# Patient Record
Sex: Female | Born: 1990 | Race: Black or African American | Hispanic: No | Marital: Single | Smoking: Former smoker
Health system: Southern US, Community
[De-identification: ages and names within clinical notes are randomized; demographics above are authoritative.]

## PROBLEM LIST (undated history)

## (undated) ENCOUNTER — Inpatient Hospital Stay (HOSPITAL_COMMUNITY): Payer: Self-pay

## (undated) DIAGNOSIS — D649 Anemia, unspecified: Secondary | ICD-10-CM

## (undated) HISTORY — PX: NO PAST SURGERIES: SHX2092

---

## 2014-06-19 ENCOUNTER — Other Ambulatory Visit (HOSPITAL_COMMUNITY)
Admission: RE | Admit: 2014-06-19 | Discharge: 2014-06-19 | Disposition: A | Discharge status: Home / Self Care | Payer: BC Managed Care – PPO | Source: Ambulatory Visit | Attending: Family Medicine | Admitting: Family Medicine

## 2014-06-19 DIAGNOSIS — Z113 Encounter for screening for infections with a predominantly sexual mode of transmission: Secondary | ICD-10-CM | POA: Insufficient documentation

## 2014-06-19 DIAGNOSIS — Z124 Encounter for screening for malignant neoplasm of cervix: Secondary | ICD-10-CM | POA: Diagnosis present

## 2014-06-19 DIAGNOSIS — Z1151 Encounter for screening for human papillomavirus (HPV): Secondary | ICD-10-CM | POA: Insufficient documentation

## 2014-06-19 DIAGNOSIS — R87810 Cervical high risk human papillomavirus (HPV) DNA test positive: Secondary | ICD-10-CM | POA: Insufficient documentation

## 2015-08-05 ENCOUNTER — Other Ambulatory Visit (HOSPITAL_COMMUNITY)
Admission: RE | Admit: 2015-08-05 | Discharge: 2015-08-05 | Disposition: A | Discharge status: Home / Self Care | Payer: BLUE CROSS/BLUE SHIELD | Source: Ambulatory Visit | Attending: Family Medicine | Admitting: Family Medicine

## 2015-08-05 DIAGNOSIS — Z1151 Encounter for screening for human papillomavirus (HPV): Secondary | ICD-10-CM | POA: Insufficient documentation

## 2015-08-05 DIAGNOSIS — Z01411 Encounter for gynecological examination (general) (routine) with abnormal findings: Secondary | ICD-10-CM | POA: Insufficient documentation

## 2015-11-03 DIAGNOSIS — Z1151 Encounter for screening for human papillomavirus (HPV): Secondary | ICD-10-CM | POA: Diagnosis present

## 2015-11-03 DIAGNOSIS — Z01411 Encounter for gynecological examination (general) (routine) with abnormal findings: Secondary | ICD-10-CM | POA: Diagnosis present

## 2017-06-29 ENCOUNTER — Inpatient Hospital Stay (HOSPITAL_COMMUNITY): Payer: Medicaid Other

## 2017-06-29 ENCOUNTER — Encounter (HOSPITAL_COMMUNITY): Payer: Self-pay | Admitting: *Deleted

## 2017-06-29 ENCOUNTER — Inpatient Hospital Stay (HOSPITAL_COMMUNITY)
Admission: AD | Admit: 2017-06-29 | Discharge: 2017-06-29 | Disposition: A | Discharge status: Home / Self Care | Payer: Medicaid Other | Source: Ambulatory Visit | Attending: Obstetrics and Gynecology | Admitting: Obstetrics and Gynecology

## 2017-06-29 DIAGNOSIS — O26891 Other specified pregnancy related conditions, first trimester: Secondary | ICD-10-CM | POA: Insufficient documentation

## 2017-06-29 DIAGNOSIS — Z3A01 Less than 8 weeks gestation of pregnancy: Secondary | ICD-10-CM

## 2017-06-29 DIAGNOSIS — R109 Unspecified abdominal pain: Secondary | ICD-10-CM | POA: Insufficient documentation

## 2017-06-29 DIAGNOSIS — O9989 Other specified diseases and conditions complicating pregnancy, childbirth and the puerperium: Secondary | ICD-10-CM

## 2017-06-29 DIAGNOSIS — O209 Hemorrhage in early pregnancy, unspecified: Secondary | ICD-10-CM

## 2017-06-29 HISTORY — DX: Anemia, unspecified: D64.9

## 2017-06-29 LAB — URINALYSIS, ROUTINE W REFLEX MICROSCOPIC
Bacteria, UA: NONE SEEN
Bilirubin Urine: NEGATIVE
GLUCOSE, UA: NEGATIVE mg/dL
Ketones, ur: 5 mg/dL — AB
NITRITE: NEGATIVE
PH: 6 (ref 5.0–8.0)
Protein, ur: NEGATIVE mg/dL
SPECIFIC GRAVITY, URINE: 1.026 (ref 1.005–1.030)

## 2017-06-29 LAB — CBC
HEMATOCRIT: 38.9 % (ref 36.0–46.0)
HEMOGLOBIN: 12.6 g/dL (ref 12.0–15.0)
MCH: 26.2 pg (ref 26.0–34.0)
MCHC: 32.4 g/dL (ref 30.0–36.0)
MCV: 80.9 fL (ref 78.0–100.0)
Platelets: 338 10*3/uL (ref 150–400)
RBC: 4.81 MIL/uL (ref 3.87–5.11)
RDW: 16.3 % — ABNORMAL HIGH (ref 11.5–15.5)
WBC: 12.1 10*3/uL — ABNORMAL HIGH (ref 4.0–10.5)

## 2017-06-29 LAB — HCG, QUANTITATIVE, PREGNANCY: hCG, Beta Chain, Quant, S: 27201 m[IU]/mL — ABNORMAL HIGH (ref <5)

## 2017-06-29 LAB — ABO/RH: ABO/RH(D): B POS

## 2017-06-29 LAB — POCT PREGNANCY, URINE: Preg Test, Ur: POSITIVE — AB

## 2017-06-29 NOTE — MAU Note (Signed)
Pt reports positive preg test on 12/03, states for the last few days she has been having lower abd cramping and spotting.

## 2017-06-29 NOTE — Discharge Instructions (Signed)
Preventing Birth Defects with Folic Acid WHAT IS FOLIC ACID? Folic acid is a vitamin that is used by the body to create new cells and keep the blood healthy. Everyone needs folic acid to stay healthy. It is especially important if you are pregnant. Folic acid is artificial (synthetic). The vitamin in its natural form is called folate. Some foods are natural sources of folate, and other foods have folic acid added to them (fortified foods). You can also buy folic acid supplements or vitamins that contain folic acid. WHY IS FOLIC ACID IMPORTANT DURING PREGNANCY? Folic acid helps reduce your baby's risk of serious birth defects, especially:  Spina bifida. Spina bifida occurs when a baby's spinal column does not develop completely. This can cause serious, long-term (chronic) disabilities.  Anencephaly. This is a birth defect that causes your baby to be missing parts of the brain, scalp, and skull when he or she is born. Most babies born with anencephaly only live for a short amount of time.  Spina bifida and anencephaly are commonly associated with a lack of folic acid during pregnancy. HOW MUCH FOLIC ACID DO I NEED? If you are pregnant or planning to become pregnant, your health care provider may prescribe vitamins that contain the right amount of folic acid that you need.  If you plan to become pregnant, you should take at least 400 mcg (micrograms) of folic acid daily. Birth defects usually occur in the earliest stages of pregnancy, often before you know you are pregnant. Taking folic acid when you start trying to become pregnant can help prevent birth defects.  While you are pregnant, you need at least 600 mcg of folic acid each day.  What nutrition changes can be made? To increase your intake of folate and folic acid, you may:  Eat more foods that are fortified with folic acid. Check food labels to see whether a food contains folic acid. Foods that are commonly fortified with folic acid  include: ? Cereals. ? Pastas. ? Flours. ? White rice. ? Breads.  Eat more foods that are natural sources of folate, such as: ? Legumes, including lentils, peas, and beans. ? Nuts. ? Vegetables, especially dark green leafy vegetables, such as spinach and mustard greens. ? Citrus fruits and juices.  Your health care provider may still recommend that you take a supplement to make sure that you get enough to reduce the risk of birth defects. Where to find support:  Talk with your health care provider or your pharmacist to find a folic acid supplement that is right for you.  Your health care provider may recommend that you see a nutrition specialist (nutritionist). A nutritionist can help you make healthy food choices and get more folate from your diet.  Nutrition education programs may be available through your community health department. Where to find more information: Visit the following websites to learn more about the importance of folic acid.  Centers for Disease Control and Prevention: http://steele.com/www.cdc.gov/ncbddd/folicacid/about.html  The Office on Women's Health: iknowitconsult.comwomenshealth.gov/publications/our-publications/fact-sheet/folic-acid.html  [employer]: ods.RentRule.siod.nih.gov/factsheets/Folate-Consumer  The March of Dimes: www.marchofdimes.org/pregnancy/folic-acid.aspx  Summary  It is important to start taking folic acid when you are planning to become pregnant, because many birth defects can happen before you know that you are pregnant.  You can get more folate and folic acid from your diet. Look for certain foods that are fortified with folic acid.  Your health care provider may recommend that you take a supplement to get enough folic acid. This information is not  intended to replace advice given to you by your health care provider. Make sure you discuss any questions you have with your health care provider. Document Released: 08/04/2015 Document Revised: 01/21/2016  Document Reviewed: 08/04/2015 Elsevier Interactive Patient Education  [employer]2018 Elsevier Inc.

## 2017-06-29 NOTE — MAU Provider Note (Cosign Needed)
  History     CSN: [id number]  Arrival date and time: 06/29/17 1727   None     Chief Complaint  Patient presents with  . Possible Pregnancy  . Abdominal Pain  . Vaginal Bleeding   26 yo G1P0 at 5830w3d based on LMP here for vaginal spotting 2 days ago that resolved spontaneously. Spotting started again today and patient's mother told her to come here for evaluation.Patient says she has very light, mild cramping in lower abdomen. Denies fever, sweats, chills.     No past medical history on file.  No family history on file.  Social History   Tobacco Use  . Smoking status: Not on file  Substance Use Topics  . Alcohol use: Not on file  . Drug use: Not on file    Allergies: Allergies not on file  No medications prior to admission.    Review of Systems  Constitutional: Negative for activity change, appetite change and chills.  HENT: Negative for congestion and ear discharge.   Eyes: Negative for discharge and itching.  Respiratory: Negative for apnea and chest tightness.   Cardiovascular: Negative for chest pain and leg swelling.  Gastrointestinal: Positive for abdominal pain. Negative for abdominal distention.  Endocrine: Negative for cold intolerance and heat intolerance.  Genitourinary: Positive for vaginal bleeding. Negative for vaginal pain.  Allergic/Immunologic: Negative for environmental allergies and food allergies.  Neurological: Negative for dizziness and light-headedness.  Hematological: Negative for adenopathy. Does not bruise/bleed easily.   Physical Exam   Blood pressure (!) 148/70, pulse (!) 108, temperature 98.4 F (36.9 C), temperature source Oral, resp. rate 16, height 5\' 5"  (1.651 m), weight 123 lb (55.8 kg), last menstrual period 05/15/2017, SpO2 100 %.  Physical Exam  Constitutional: She is oriented to person, place, and time. She appears well-developed and well-nourished.  HENT:  Head: Normocephalic and atraumatic.  Eyes: Conjunctivae are  normal. Pupils are equal, round, and reactive to light.  Cardiovascular: Normal rate and intact distal pulses.  Respiratory: Effort normal. No respiratory distress.  GI: Soft. She exhibits no mass. There is no tenderness. There is no rebound and no guarding.  Musculoskeletal: Normal range of motion. She exhibits no edema.  Neurological: She is alert and oriented to person, place, and time.  Skin: Skin is warm and dry.  Psychiatric: She has a normal mood and affect. Her behavior is normal.    MAU Course  Procedures  MDM Transvaginal US and hcg ordered. US showed intrauterine pregnancy and small subchorionic hemorrhage. HCG 27,000. No ectopic but cannot rule out SAB. Patient will follow up in 1 week for repeat hcg.  Assessment and Plan  1. Pregnancy at 6 weeks completed gestation- start prenatal vitamin. Follow up in clinic in 1 week for repeat hcg. Establish prenatal care.  Kimberly Humphrey 06/29/2017, 8:21 PM

## 2017-06-30 LAB — HIV ANTIBODY (ROUTINE TESTING W REFLEX): HIV Screen 4th Generation wRfx: NONREACTIVE

## 2017-07-06 ENCOUNTER — Other Ambulatory Visit: Payer: Self-pay

## 2017-07-06 ENCOUNTER — Inpatient Hospital Stay (HOSPITAL_COMMUNITY)
Admission: AD | Admit: 2017-07-06 | Discharge: 2017-07-06 | Disposition: A | Discharge status: Home / Self Care | Payer: Medicaid Other | Source: Ambulatory Visit | Attending: Obstetrics and Gynecology | Admitting: Obstetrics and Gynecology

## 2017-07-06 DIAGNOSIS — Z3A01 Less than 8 weeks gestation of pregnancy: Secondary | ICD-10-CM | POA: Diagnosis not present

## 2017-07-06 DIAGNOSIS — Z09 Encounter for follow-up examination after completed treatment for conditions other than malignant neoplasm: Secondary | ICD-10-CM | POA: Insufficient documentation

## 2017-07-06 LAB — HCG, QUANTITATIVE, PREGNANCY: HCG, BETA CHAIN, QUANT, S: 76877 m[IU]/mL — AB (ref <5)

## 2017-07-06 NOTE — MAU Provider Note (Signed)
Kimberly Humphrey is a 26 y.o. female G1P0 @ 3526w3d here in MAU for a follow up.  She was here 1 week ago For vaginal spotting. She denies pain or bleeding at this time. Reviewed US with the patient in detail which shows a healthy pregnancy with cardiac activity.  Quant not needed today. Patient agrees with plan of care. Patient to start prenatal care. Return to MAU if symptoms worsen.    Duane Lopeasch, Jay Haskew I, NP 07/06/2017 4:33 PM

## 2017-07-06 NOTE — MAU Note (Signed)
Pt did not need f/u. dc'd by NP.  Lab cancelled, though already drawn, no charge for visit

## 2017-07-06 NOTE — MAU Note (Signed)
Doing good.  No further bleeding, occ cramping, none today.

## 2017-07-17 NOTE — L&D Delivery Note (Signed)
Patient: Kimberly MayersKeydra M Elster MRN: [id number]  GBS status: positive, IAP given  Patient is a 27 y.o. now G1P1001 s/p NSVD at 5967w6d, who was admitted for SOL. AROM 0h 8830m prior to delivery with clear fluid.   Delivery Note At 11:10 PM a viable female was delivered via Vaginal, Spontaneous (Presentation: vertex; ROA ).  APGAR: 7, 9; weight pending.   Placenta status: intact, .  Cord: 3-vessel  Anesthesia:  Epidural Episiotomy: None Lacerations:  Bilateral labial Suture Repair: 3.0 Monocryl Est. Blood Loss (mL):  200  Mom to postpartum.  Baby to Couplet care / Skin to Skin.  Raynelle FanningJulie P. Lio Wehrly, MD OB Fellow 02/11/18, 11:48 PM

## 2017-08-14 ENCOUNTER — Other Ambulatory Visit (HOSPITAL_COMMUNITY)
Admission: RE | Admit: 2017-08-14 | Discharge: 2017-08-14 | Disposition: A | Discharge status: Home / Self Care | Payer: Medicaid Other | Source: Ambulatory Visit | Attending: Advanced Practice Midwife | Admitting: Advanced Practice Midwife

## 2017-08-14 ENCOUNTER — Encounter: Payer: Self-pay | Admitting: *Deleted

## 2017-08-14 ENCOUNTER — Encounter: Payer: Self-pay | Admitting: Advanced Practice Midwife

## 2017-08-14 ENCOUNTER — Ambulatory Visit (INDEPENDENT_AMBULATORY_CARE_PROVIDER_SITE_OTHER): Payer: Medicaid Other | Admitting: Advanced Practice Midwife

## 2017-08-14 DIAGNOSIS — Z3400 Encounter for supervision of normal first pregnancy, unspecified trimester: Secondary | ICD-10-CM

## 2017-08-14 DIAGNOSIS — N760 Acute vaginitis: Secondary | ICD-10-CM | POA: Diagnosis not present

## 2017-08-14 DIAGNOSIS — Z3492 Encounter for supervision of normal pregnancy, unspecified, second trimester: Secondary | ICD-10-CM | POA: Diagnosis present

## 2017-08-14 DIAGNOSIS — Z3A13 13 weeks gestation of pregnancy: Secondary | ICD-10-CM | POA: Insufficient documentation

## 2017-08-14 DIAGNOSIS — O23592 Infection of other part of genital tract in pregnancy, second trimester: Secondary | ICD-10-CM | POA: Diagnosis not present

## 2017-08-14 DIAGNOSIS — Z3401 Encounter for supervision of normal first pregnancy, first trimester: Secondary | ICD-10-CM | POA: Diagnosis not present

## 2017-08-14 DIAGNOSIS — Z23 Encounter for immunization: Secondary | ICD-10-CM

## 2017-08-14 DIAGNOSIS — B9689 Other specified bacterial agents as the cause of diseases classified elsewhere: Secondary | ICD-10-CM | POA: Diagnosis not present

## 2017-08-14 LAB — POCT URINALYSIS DIP (DEVICE)
BILIRUBIN URINE: NEGATIVE
GLUCOSE, UA: NEGATIVE mg/dL
Hgb urine dipstick: NEGATIVE
Ketones, ur: NEGATIVE mg/dL
Nitrite: NEGATIVE
Protein, ur: NEGATIVE mg/dL
SPECIFIC GRAVITY, URINE: 1.015 (ref 1.005–1.030)
Urobilinogen, UA: 1 mg/dL (ref 0.0–1.0)
pH: 7.5 (ref 5.0–8.0)

## 2017-08-14 NOTE — Progress Notes (Signed)
  Subjective:    Kimberly Humphrey is being seen today for her first obstetrical visit.  This is not a planned pregnancy. She is at 8314w0d gestation. Her obstetrical history is significant for None. Relationship with FOB: Not currently together, but cordial . Patient does intend to breast feed. Pregnancy history fully reviewed.  Patient reports headache and earache .  Review of Systems:   Review of Systems  All other systems reviewed and are negative.   Objective:     BP 122/62   Pulse 78   Wt 132 lb (59.9 kg)   LMP 05/15/2017   BMI 21.97 kg/m  Physical Exam  Nursing note and vitals reviewed. Constitutional: She is oriented to person, place, and time. She appears well-developed and well-nourished. No distress.  HENT:  Head: Normocephalic.  Right Ear: No swelling or tenderness. Tympanic membrane is not erythematous. No middle ear effusion.  Left Ear: No swelling or tenderness. Tympanic membrane is not erythematous.  No middle ear effusion.  Cardiovascular: Normal rate.  Respiratory: Effort normal.  GI: Soft. There is no tenderness. There is no rebound.  Genitourinary:  Genitourinary Comments:  External: no lesion Vagina: small amount of white discharge Cervix: pink, smooth, no CMT Uterus: AGA   Neurological: She is alert and oriented to person, place, and time.  Skin: Skin is warm and dry.  Psychiatric: She has a normal mood and affect.      Fetal Exam Fetal Monitor Review: Mode: hand-held doppler probe.   Baseline rate: 161.         Assessment:    Pregnancy: G1P0000 Patient Active Problem List   Diagnosis Date Noted  . Supervision of normal first pregnancy, antepartum 08/14/2017       Plan:     Initial labs drawn. Pap done  Prenatal vitamins. Problem list reviewed and updated. Panorama AFP discussed: requested. Too early today, plan for next visit.  Role of ultrasound in pregnancy discussed; fetal survey: requested. Amniocentesis discussed: not  indicated. Follow up in 7 weeks- baby scripts optimization  50% of 60 min visit spent on counseling and coordination of care.     Kimberly Humphrey 08/14/2017

## 2017-08-14 NOTE — Progress Notes (Signed)
Anatomy scan scheduled 3/12 @ 845am

## 2017-08-14 NOTE — Patient Instructions (Addendum)
AREA PEDIATRIC/FAMILY [place] [address], [address] [place], [place]  [address] Phone - [phone]   Fax - [phone]  [facility] [place] [address] [place], [place] [address] Phone - [phone]   Fax - [place] [address] [place], [place]  [address] Phone - [phone]   Fax - [phone]  [place]. [address] [place] [address] [place], [place]  [address] Phone - [phone]   Fax - [phone]  [place] [address] [place], [place]  [address] Phone - [phone]   Fax - [phone]  [facility] [address] [place], [place]  [address] Phone - [phone]   Fax - [place] [address] [place], [place]  [address] Phone - [phone]   Fax - [phone]  [place] [address] [place], [place] [address] [place], [place]  [address] Phone - [phone]   Fax - [place] [address] [place], [place]  [address] Phone - [phone]   Fax - [phone] [facility] [place]. [address] [place], [place]  [address] Phone - [phone]   Fax - [phone]  [place] [address] [place], [place]  [address] Phone - [phone]   Fax - [phone]  [facility] [place], [place], [place]  [address] Phone - [phone]   Fax - [place] [address] [place], [place]  [address] Phone - [phone]   Fax - [phone]  [facility] [address] [place], [place]  [address] Phone - [place], [place]  [address] Phone - [phone]   Fax - [place] [address] [place], [place]  [address] Phone - [phone]   Fax - [phone]  [place] [address] [place], [place]  [address] Phone - [phone]   Fax - [place]. [place], [place]  [address] Phone - [phone]   Fax - [place], [place], [place]  [address] Phone - [phone]   Fax - [place] [address], [place], [place]  [address] Phone - [phone]   Fax - [phone]  [place] [address] [place], [place]  [address] Phone - [phone]   Fax - [place] [address] [place], [place]  [address] Phone - [phone]   Fax - [phone]  [place] [address] [place], [place]  [address] Phone - [phone]   Fax - [phone] [place] [address] [place], [place]  [address] Phone - [phone]   Fax - [place] [address] [place], [place]  [address] Phone - [phone]   Fax - [place] [address] [place], [place]  [address] Phone - [phone]   Fax - [phone]  [place] MD [address] [place] [address] Phone [phone]  Fax [phone]  Places to have your son circumcised:    [facility] [phone] while you  are in hospital  [facility] [phone] $244 by 4 wks  [facility] [phone] $175 by 2 wks  [place] [phone] $250 by 7 days [facility] [phone] $269 by 4 wks  These prices sometimes change but are roughly what you can expect to pay. Please  call and confirm pricing.   Circumcision is considered an elective/non-medically necessary procedure. There are many reasons parents decide to have their sons circumsized. During the first year of life circumcised males have a reduced risk of urinary tract infections but after this year the rates between circumcised males and uncircumcised males are the same.  It is safe to have your son circumcised outside of the hospital and the places above perform them regularly.   Deciding about Circumcision in Baby Boys  (Up-to-date The Basics)  What is circumcision?  Circumcision is a surgery that removes the skin that covers the tip of the penis, called the "foreskin" Circumcision is usually done when a boy is between 42 and 85 days old. In the [place], circumcision is common. In some other countries, fewer boys are circumcised. Circumcision is a common tradition in some religions.  Should I have my baby boy circumcised?  There is no easy answer. Circumcision has some benefits. But it also has risks. After talking with your doctor, you will have to decide for yourself what is right for your family.  What are the benefits of circumcision?  Circumcised boys seem to have slightly lower rates of: ?Urinary tract infections ?Swelling of the opening at the tip of the penis Circumcised men seem to have slightly lower rates of: ?Urinary tract infections ?Swelling of the opening at the tip of the penis ?Penis cancer ?HIV and other infections that you catch during sex ?Cervical cancer in the women they have sex with Even so, in the [place], the risks of these problems are small - even in boys and men who have not been circumcised. Plus, boys and men who are not circumcised can reduce these extra risks by: ?Cleaning their penis well ?Using condoms during sex  What are the risks of circumcision?  Risks include: ?Bleeding or infection from the surgery ?Damage to or amputation of the  penis ?A chance that the doctor will cut off too much or not enough of the foreskin ?A chance that sex won't feel as good later in life Only about 1 out of every 200 circumcisions leads to problems. There is also a chance that your health insurance won't pay for circumcision.  How is circumcision done in baby boys?  First, the baby gets medicine for pain relief. This might be a cream on the skin or a shot into the base of the penis. Next, the doctor cleans the baby's penis well. Then he or she uses special tools to cut off the foreskin. Finally, the doctor wraps a bandage (called gauze) around the baby's penis. If you have your baby circumcised, his doctor or nurse will give you instructions on how to care for him after the surgery. It is important that you follow those instructions carefully. Childbirth Education Options: [facility] Department Classes:  Childbirth education classes can help you get ready for a positive parenting experience. You can also meet other expectant parents and get free stuff for your baby. Each class runs for five weeks on the same night and costs $45 for the mother-to-be and her support person. Medicaid covers the cost if you are eligible. Call [phone] to register. [facility] Childbirth Education:  [phone]  or [phone] or [email or web address]  Baby & Me Class: Discuss newborn & infant parenting and family adjustment issues with other new mothers in a relaxed environment. Each week brings a new speaker or baby-centered activity. We encourage new mothers to join us every Thursday at 11:00am. Babies birth until crawling. No registration or fee. Daddy Boot Camp: This course offers Dads-to-be the tools and knowledge needed to feel confident on their journey to becoming new fathers. Experienced dads, who have been trained as coaches, teach dads-to-be how to hold, comfort, diaper, swaddle and play with their infant while being able to support  the new mom as well. A class for men taught by men. $25/dad Big Brother/Big Sister: Let your children share in the joy of a new brother or sister in this special class designed just for them. Class includes discussion about how families care for babies: swaddling, holding, diapering, safety as well as how they can be helpful in their new role. This class is designed for children ages 2 to 6, but any age is welcome. Please register each child individually. $5/child  Mom Talk: This mom-led group offers support and connection to mothers as they journey through the adjustments and struggles of that sometimes overwhelming first year after the birth of a child. Tuesdays at 10:00am and Thursdays at 6:00pm. Babies welcome. No registration or fee. Breastfeeding Support Group: This group is a mother-to-mother support circle where moms have the opportunity to share their breastfeeding experiences. A Lactation Consultant is present for questions and concerns. Meets each Tuesday at 11:00am. No fee or registration. Breastfeeding Your Baby: Learn what to expect in the first days of breastfeeding your newborn.  This class will help you feel more confident with the skills needed to begin your breastfeeding experience. Many new mothers are concerned about breastfeeding after leaving the hospital. This class will also address the most common fears and challenges about breastfeeding during the first few weeks, months and beyond. (call for fee) Comfort Techniques and Tour: This 2 hour interactive class will provide you the opportunity to learn & practice hands-on techniques that can help relieve some of the discomfort of labor and encourage your baby to rotate toward the best position for birth. You and your partner will be able to try a variety of labor positions with birth balls and rebozos as well as practice breathing, relaxation, and visualization techniques. A tour of the [facility] is included  with this class. $20 per registrant and support person Childbirth Class- Weekend Option: This class is a Weekend version of our Birth & Baby series. It is designed for parents who have a difficult time fitting several weeks of classes into their schedule. It covers the care of your newborn and the basics of labor and childbirth. It also includes a Maternity Care Center Tour of [facility] and lunch. The class is held two consecutive days: beginning on Friday evening from 6:30 - 8:30 p.m. and the next day, Saturday from 9 a.m. - 4 p.m. (call for fee) Waterbirth Class: Interested in a waterbirth?  This informational class will help you discover whether waterbirth is the right fit for you. Education about waterbirth itself, supplies you would need and how to assemble your support team is what you can expect from this class. Some obstetrical practices require this class in order to pursue a waterbirth. (Not all obstetrical practices offer waterbirth-check with your healthcare provider.) Register only the expectant mom, but you are encouraged to bring your partner to   class! Required if planning waterbirth, no fee. Infant/Child CPR: Parents, grandparents, babysitters, and friends learn Cardio-Pulmonary Resuscitation skills for infants and children. You will also learn how to treat both conscious and unconscious choking in infants and children. This Family & Friends program does not offer certification. Register each participant individually to ensure that enough mannequins are available. (Call for fee) Grandparent Love: Expecting a grandbaby? This class is for you! Learn about the latest infant care and safety recommendations and ways to support your own child as he or she transitions into the parenting role. Taught by Registered Nurses who are childbirth instructors, but most importantly...they are grandmothers too! $10/person. Childbirth Class- Natural Childbirth: This series of 5 weekly classes is for  expectant parents who want to learn and practice natural methods of coping with the process of labor and childbirth. Relaxation, breathing, massage, visualization, role of the partner, and helpful positioning are highlighted. Participants learn how to be confident in their body's ability to give birth. This class will empower and help parents make informed decisions about their own care. Includes discussion that will help new parents transition into the immediate postpartum period. [facility] is included. We suggest taking this class between 25-32 weeks, but it's only a recommendation. $75 per registrant and one support person or $30 Medicaid. Childbirth Class- 3 week Series: This option of 3 weekly classes helps you and your labor partner prepare for childbirth. Newborn care, labor & birth, cesarean birth, pain management, and comfort techniques are discussed and a [place] [facility] is included. The class meets at the same time, on the same day of the week for 3 consecutive weeks beginning with the starting date you choose. $60 for registrant and one support person.  Marvelous Multiples: Expecting twins, triplets, or more? This class covers the differences in labor, birth, parenting, and breastfeeding issues that face multiples' parents. NICU tour is included. Led by a Certified Childbirth Educator who is the mother of twins. No fee. Caring for Baby: This class is for expectant and adoptive parents who want to learn and practice the most up-to-date newborn care for their babies. Focus is on birth through the first six weeks of life. Topics include feeding, bathing, diapering, crying, umbilical cord care, circumcision care and safe sleep. Parents learn to recognize symptoms of illness and when to call the pediatrician. Register only the mom-to-be and your partner or support person can plan to come with you! $10 per registrant and support  person Childbirth Class- online option: This online class offers you the freedom to complete a Birth and Baby series in the comfort of your own home. The flexibility of this option allows you to review sections at your own pace, at times convenient to you and your support people. It includes additional video information, animations, quizzes, and extended activities. Get organized with helpful eClass tools, checklists, and trackers. Once you register online for the class, you will receive an email within a few days to accept the invitation and begin the class when the time is right for you. The content will be available to you for 60 days. $60 for 60 days of online access for you and your support people.  Local Doulas: Natural Baby Doulas [email or web address] Tel: [phone] [email or web address] [employer] [phone] [email or web address] .[email or web address] The Labor Hassell Halim  (also do waterbirth tub rental) [phone] [email or web address] Triad Birth Doula [phone] [email or web address] [email or web address] Sacred Rhythms  [phone] https://[email or web address]-[email or web address]/ [employer] (PADA) [email or web address] https://www.[email or web address]/ [place]  [place] Birth and Baby  http://[email or web address]/ Considering Waterbirth? Guide for patients at [facility] [employer]  Why consider waterbirth?  . Gentle birth for babies . Less pain medicine used in labor . May allow for passive descent/less pushing . May reduce perineal tears  . More mobility and instinctive maternal position changes . Increased maternal relaxation . Reduced blood pressure in labor  Is waterbirth safe? What are the risks of infection, drowning or other complications?  . Infection: o Very low risk (3.7 % for tub vs 4.8% for bed) o 7 in 8000 waterbirths with documented infection o Poorly  cleaned equipment most common cause o Slightly lower group B strep transmission rate  . Drowning o Maternal:  - Very low risk   - Related to seizures or fainting o Newborn:  - Very low risk. No evidence of increased risk of respiratory problems in multiple large studies - Physiological protection from breathing under water - Avoid underwater birth if there are any fetal complications - Once baby's head is out of the water, keep it out.  . Birth complication o Some reports of cord trauma, but risk decreased by bringing baby to surface gradually o No evidence of increased risk of shoulder dystocia. Mothers can usually change positions faster in water than in a bed, possibly aiding the maneuvers to free the shoulder.   You must attend a Doren Custard class at [facility]  3rd Wednesday of every month from 7-9pm  [employer] by calling [phone] or online at [email or web address]  Bring [place] the certificate from the class to your prenatal appointment  Meet with a midwife at 36 weeks to see if you can still plan a waterbirth and to sign the consent.   Purchase or rent the following supplies:   Water Birth Pool (Birth Pool in a Box or [place] for instance)  (Tubs start ~$125)  Single-use disposable tub liner designed for your brand of tub  New garden hose labeled "lead-free", "suitable for drinking water",  Electric drain pump to remove water (We recommend 792 gallon per hour or greater pump.)   Separate garden hose to remove the dirty water  Fish net  Bathing suit top (optional)  Long-handled mirror (optional)  Places to purchase or rent supplies  GotWebTools.is for tub purchases and supplies  Waterbirthsolutions.com for tub purchases and supplies  The Labor Ladies (www.[email or web address]) $275 for tub rental/set-up & take down/kit   [employer] (http://www.fleming.com/.htm) Information regarding doulas (labor support) who  provide pool rentals  Our practice has a Birth Pool in a Box tub at the hospital that you may borrow on a first-come-first-served basis. It is your responsibility to to set up, clean and break down the tub. We cannot guarantee the availability of this tub in advance. You are responsible for bringing all accessories listed above. If you do not have all necessary supplies you cannot have a waterbirth.    Things that would prevent you from having a waterbirth:  Premature, <37wks  Previous cesarean birth  Presence of thick meconium-stained fluid  Multiple gestation (Twins, triplets, etc.)  Uncontrolled diabetes or gestational diabetes requiring medication  Hypertension requiring medication or diagnosis of pre-eclampsia  Heavy vaginal bleeding  Non-reassuring fetal heart rate  Active infection (MRSA, etc.). Group B Strep is NOT a contraindication for  waterbirth.  If your labor has to be induced and induction method requires continuous  monitoring of the baby's heart rate  Other risks/issues identified by your obstetrical provider  Please remember  that birth is unpredictable. Under certain unforeseeable circumstances your provider may advise against giving birth in the tub. These decisions will be made on a case-by-case basis and with the safety of you and your baby as our highest priority.   Safe Medications in Pregnancy   Acne: Benzoyl Peroxide Salicylic Acid  Backache/Headache: Tylenol: 2 regular strength every 4 hours OR              2 Extra strength every 6 hours  Colds/Coughs/Allergies: Benadryl (alcohol free) 25 mg every 6 hours as needed Breath right strips Claritin Cepacol throat lozenges Chloraseptic throat spray Cold-Eeze- up to three times per day Cough drops, alcohol free Flonase (by prescription only) Guaifenesin Mucinex Robitussin DM (plain only, alcohol free) Saline nasal spray/drops Sudafed (pseudoephedrine) & Actifed ** use only after [redacted] weeks  gestation and if you do not have high blood pressure Tylenol Vicks Vaporub Zinc lozenges Zyrtec   Constipation: Colace Ducolax suppositories Fleet enema Glycerin suppositories Metamucil Milk of magnesia Miralax Senokot Smooth move tea  Diarrhea: Kaopectate Imodium A-D  *NO pepto Bismol  Hemorrhoids: Anusol Anusol HC Preparation H Tucks  Indigestion: Tums Maalox Mylanta Zantac  Pepcid  Insomnia: Benadryl (alcohol free) 25mg every 6 hours as needed Tylenol PM Unisom, no Gelcaps  Leg Cramps: Tums MagGel  Nausea/Vomiting:  [place] Dramamine Emetrol Ginger extract Sea bands Meclizine  Nausea medication to take during pregnancy:  Unisom (doxylamine succinate 25 mg tablets) Take one tablet daily at bedtime. If symptoms are not adequately controlled, the dose can be increased to a maximum recommended dose of two tablets daily (1/2 tablet in the morning, 1/2 tablet mid-afternoon and one at bedtime). Vitamin B6 100mg tablets. Take one tablet twice a day (up to 200 mg per day).  Skin Rashes: Aveeno products Benadryl cream or 25mg every 6 hours as needed Calamine Lotion 1% cortisone cream  Yeast infection: Gyne-lotrimin 7 Monistat 7   **If taking multiple medications, please check labels to avoid duplicating the same active ingredients **take medication as directed on the label ** Do not exceed 4000 mg of tylenol in 24 hours **Do not take medications that contain aspirin or ibuprofen     

## 2017-08-15 LAB — CERVICOVAGINAL ANCILLARY ONLY
Bacterial vaginitis: POSITIVE — AB
CANDIDA VAGINITIS: NEGATIVE
Trichomonas: NEGATIVE

## 2017-08-15 LAB — CYTOLOGY - PAP
Chlamydia: POSITIVE — AB
Diagnosis: NEGATIVE
NEISSERIA GONORRHEA: NEGATIVE

## 2017-08-16 ENCOUNTER — Telehealth: Payer: Self-pay

## 2017-08-16 DIAGNOSIS — N760 Acute vaginitis: Principal | ICD-10-CM

## 2017-08-16 DIAGNOSIS — B9689 Other specified bacterial agents as the cause of diseases classified elsewhere: Secondary | ICD-10-CM

## 2017-08-16 DIAGNOSIS — A749 Chlamydial infection, unspecified: Secondary | ICD-10-CM

## 2017-08-16 LAB — URINE CULTURE, OB REFLEX: Organism ID, Bacteria: NO GROWTH

## 2017-08-16 LAB — CULTURE, OB URINE

## 2017-08-16 MED ORDER — AZITHROMYCIN 250 MG PO TABS: 1000.0000 mg | ORAL_TABLET | Freq: Once | ORAL | 0 refills | Status: AC

## 2017-08-16 MED ORDER — METRONIDAZOLE 500 MG PO TABS: 500.0000 mg | ORAL_TABLET | Freq: Two times a day (BID) | ORAL | 0 refills | Status: DC

## 2017-08-16 NOTE — Telephone Encounter (Signed)
Pt tested positive for Chlamydia and BV. Called pt two times. Got vm. Left message for pt to call our office back.[facility]

## 2017-08-17 ENCOUNTER — Encounter: Payer: Self-pay | Admitting: Advanced Practice Midwife

## 2017-08-17 ENCOUNTER — Telehealth: Payer: Self-pay | Admitting: *Deleted

## 2017-08-17 NOTE — Telephone Encounter (Signed)
Pt returned call, think it may be for test results.

## 2017-08-21 ENCOUNTER — Encounter: Payer: Self-pay | Admitting: Family Medicine

## 2017-08-21 DIAGNOSIS — Z3400 Encounter for supervision of normal first pregnancy, unspecified trimester: Secondary | ICD-10-CM

## 2017-08-21 NOTE — Progress Notes (Signed)
Chart reviewed.

## 2017-08-22 LAB — OBSTETRIC PANEL, INCLUDING HIV
ANTIBODY SCREEN: NEGATIVE
BASOS ABS: 0 10*3/uL (ref 0.0–0.2)
BASOS: 0 %
EOS (ABSOLUTE): 0 10*3/uL (ref 0.0–0.4)
Eos: 0 %
HEMATOCRIT: 31.1 % — AB (ref 34.0–46.6)
HIV Screen 4th Generation wRfx: NONREACTIVE
Hemoglobin: 10.4 g/dL — ABNORMAL LOW (ref 11.1–15.9)
Hepatitis B Surface Ag: NEGATIVE
IMMATURE GRANS (ABS): 0 10*3/uL (ref 0.0–0.1)
IMMATURE GRANULOCYTES: 0 %
LYMPHS: 21 %
Lymphocytes Absolute: 1.9 10*3/uL (ref 0.7–3.1)
MCH: 27 pg (ref 26.6–33.0)
MCHC: 33.4 g/dL (ref 31.5–35.7)
MCV: 81 fL (ref 79–97)
MONOCYTES: 10 %
MONOS ABS: 0.9 10*3/uL (ref 0.1–0.9)
NEUTROS PCT: 69 %
Neutrophils Absolute: 5.9 10*3/uL (ref 1.4–7.0)
PLATELETS: 280 10*3/uL (ref 150–379)
RBC: 3.85 x10E6/uL (ref 3.77–5.28)
RDW: 18.6 % — AB (ref 12.3–15.4)
RPR Ser Ql: NONREACTIVE
RUBELLA: 4.17 {index} (ref >0.99)
Rh Factor: POSITIVE
WBC: 8.7 10*3/uL (ref 3.4–10.8)

## 2017-08-22 LAB — HEMOGLOBINOPATHY EVALUATION
Ferritin: 17 ng/mL (ref 15–150)
HGB A: 97.8 % (ref 96.4–98.8)
HGB F QUANT: 0 % (ref 0.0–2.0)
HGB S: 0 %
HGB SOLUBILITY: NEGATIVE
HGB VARIANT: 0 %
Hgb A2 Quant: 2.2 % (ref 1.8–3.2)
Hgb C: 0 %

## 2017-08-22 LAB — SMN1 COPY NUMBER ANALYSIS (SMA CARRIER SCREENING)

## 2017-08-23 ENCOUNTER — Encounter: Payer: Self-pay | Admitting: *Deleted

## 2017-08-24 NOTE — Telephone Encounter (Signed)
Attempted to call patient back to see if she still needed assistance. Left message to call us back if she still has concerns.

## 2017-08-28 ENCOUNTER — Other Ambulatory Visit: Payer: Self-pay | Admitting: General Practice

## 2017-08-28 DIAGNOSIS — B379 Candidiasis, unspecified: Secondary | ICD-10-CM

## 2017-08-28 DIAGNOSIS — T3695XA Adverse effect of unspecified systemic antibiotic, initial encounter: Principal | ICD-10-CM

## 2017-08-28 MED ORDER — TERCONAZOLE 0.4 % VA CREA: 1.0000 | TOPICAL_CREAM | Freq: Every day | VAGINAL | 0 refills | Status: DC

## 2017-09-10 ENCOUNTER — Encounter: Payer: Self-pay | Admitting: Advanced Practice Midwife

## 2017-09-17 ENCOUNTER — Encounter (HOSPITAL_COMMUNITY): Payer: Self-pay | Admitting: Advanced Practice Midwife

## 2017-09-25 ENCOUNTER — Other Ambulatory Visit: Payer: Self-pay | Admitting: Advanced Practice Midwife

## 2017-09-25 ENCOUNTER — Ambulatory Visit (HOSPITAL_COMMUNITY)
Admission: RE | Admit: 2017-09-25 | Discharge: 2017-09-25 | Disposition: A | Discharge status: Home / Self Care | Payer: Medicaid Other | Source: Ambulatory Visit | Attending: Advanced Practice Midwife | Admitting: Advanced Practice Midwife

## 2017-09-25 DIAGNOSIS — Z3689 Encounter for other specified antenatal screening: Secondary | ICD-10-CM | POA: Diagnosis not present

## 2017-09-25 DIAGNOSIS — O358XX0 Maternal care for other (suspected) fetal abnormality and damage, not applicable or unspecified: Secondary | ICD-10-CM

## 2017-09-25 DIAGNOSIS — Z3A19 19 weeks gestation of pregnancy: Secondary | ICD-10-CM

## 2017-09-25 DIAGNOSIS — O35EXX0 Maternal care for other (suspected) fetal abnormality and damage, fetal genitourinary anomalies, not applicable or unspecified: Secondary | ICD-10-CM

## 2017-09-25 DIAGNOSIS — Z3400 Encounter for supervision of normal first pregnancy, unspecified trimester: Secondary | ICD-10-CM

## 2017-10-03 ENCOUNTER — Other Ambulatory Visit (HOSPITAL_COMMUNITY)
Admission: RE | Admit: 2017-10-03 | Discharge: 2017-10-03 | Disposition: A | Discharge status: Home / Self Care | Payer: Medicaid Other | Source: Ambulatory Visit | Attending: Advanced Practice Midwife | Admitting: Advanced Practice Midwife

## 2017-10-03 ENCOUNTER — Ambulatory Visit (INDEPENDENT_AMBULATORY_CARE_PROVIDER_SITE_OTHER): Payer: Medicaid Other | Admitting: Advanced Practice Midwife

## 2017-10-03 ENCOUNTER — Encounter: Payer: Self-pay | Admitting: Advanced Practice Midwife

## 2017-10-03 VITALS — BP 122/62 | HR 72 | Wt 136.6 lb

## 2017-10-03 DIAGNOSIS — A749 Chlamydial infection, unspecified: Secondary | ICD-10-CM | POA: Insufficient documentation

## 2017-10-03 DIAGNOSIS — Z3402 Encounter for supervision of normal first pregnancy, second trimester: Secondary | ICD-10-CM

## 2017-10-03 DIAGNOSIS — Z3400 Encounter for supervision of normal first pregnancy, unspecified trimester: Secondary | ICD-10-CM

## 2017-10-03 NOTE — Patient Instructions (Addendum)
At your next appointment at 28 weeks we will do a test for pregnancy diabetes. Please make sure that when you come to your appointment you have not had anything to eat or drink after midnight the night before. The test will take 2 hours so you will be here for a longer than usual time at your next appointment. Please plan for this.   Childbirth Education Options: [facility] Department Classes:  Childbirth education classes can help you get ready for a positive parenting experience. You can also meet other expectant parents and get free stuff for your baby. Each class runs for five weeks on the same night and costs $45 for the mother-to-be and her support person. Medicaid covers the cost if you are eligible. Call [phone] to register. [facility] Childbirth Education:  [phone] or [phone] or [email or web address]  Baby & Me Class: Discuss newborn & infant parenting and family adjustment issues with other new mothers in a relaxed environment. Each week brings a new speaker or baby-centered activity. We encourage new mothers to join [place] every Thursday at 11:00am. Babies birth until crawling. No registration or fee. Daddy [employer]: This course offers Dads-to-be the tools and knowledge needed to feel confident on their journey to becoming new fathers. Experienced dads, who have been trained as coaches, teach dads-to-be how to hold, comfort, diaper, swaddle and play with their infant while being able to support the new mom as well. A class for men taught by men. $25/dad Big Brother/Big Sister: Let your children share in the joy of a new brother or sister in this special class designed just for them. Class includes discussion about how families care for babies: swaddling, holding, diapering, safety as well as how they can be helpful in their new role. This class is designed for children ages 29 to 1, but any age is welcome. Please register each child individually. $5/child  Mom  Talk: This mom-led group offers support and connection to mothers as they journey through the adjustments and struggles of that sometimes overwhelming first year after the birth of a child. Tuesdays at 10:00am and Thursdays at 6:00pm. Babies welcome. No registration or fee. Breastfeeding Support Group: This group is a mother-to-mother support circle where moms have the opportunity to share their breastfeeding experiences. A Lactation Consultant is present for questions and concerns. Meets each Tuesday at 11:00am. No fee or registration. Breastfeeding Your Baby: Learn what to expect in the first days of breastfeeding your newborn.  This class will help you feel more confident with the skills needed to begin your breastfeeding experience. Many new mothers are concerned about breastfeeding after leaving the hospital. This class will also address the most common fears and challenges about breastfeeding during the first few weeks, months and beyond. (call for fee) Comfort Techniques and Tour: This 2 hour interactive class will provide you the opportunity to learn & practice hands-on techniques that can help relieve some of the discomfort of labor and encourage your baby to rotate toward the best position for birth. You and your partner will be able to try a variety of labor positions with birth balls and rebozos as well as practice breathing, relaxation, and visualization techniques. A tour of the [facility] is included with this class. $20 per registrant and support person Childbirth Class- Weekend Option: This class is a Weekend version of our Birth & Baby series. It is designed for parents who have a difficult time fitting several weeks of classes into their  schedule. It covers the care of your newborn and the basics of labor and childbirth. It also includes a [place] [facility] and lunch. The class is held two consecutive days: beginning on Friday evening  from 6:30 - 8:30 p.m. and the next day, Saturday from 9 a.m. - 4 p.m. (call for fee) Doren Custard Class: Interested in a waterbirth?  This informational class will help you discover whether waterbirth is the right fit for you. Education about waterbirth itself, supplies you would need and how to assemble your support team is what you can expect from this class. Some obstetrical practices require this class in order to pursue a waterbirth. (Not all obstetrical practices offer waterbirth-check with your healthcare provider.) Register only the expectant mom, but you are encouraged to bring your partner to class! Required if planning waterbirth, no fee. Infant/Child CPR: Parents, grandparents, babysitters, and friends learn Cardio-Pulmonary Resuscitation skills for infants and children. You will also learn how to treat both conscious and unconscious choking in infants and children. This Family & Friends program does not offer certification. Register each participant individually to ensure that enough mannequins are available. (Call for fee) Grandparent Love: Expecting a grandbaby? This class is for you! Learn about the latest infant care and safety recommendations and ways to support your own child as he or she transitions into the parenting role. Taught by Registered Nurses who are childbirth instructors, but most importantly...they are grandmothers too! $10/person. Childbirth Class- Natural Childbirth: This series of 5 weekly classes is for expectant parents who want to learn and practice natural methods of coping with the process of labor and childbirth. Relaxation, breathing, massage, visualization, role of the partner, and helpful positioning are highlighted. Participants learn how to be confident in their body's ability to give birth. This class will empower and help parents make informed decisions about their own care. Includes discussion that will help new parents transition into the immediate postpartum  period. [facility] is included. We suggest taking this class between 25-32 weeks, but it's only a recommendation. $75 per registrant and one support person or $30 Medicaid. Childbirth Class- 3 week Series: This option of 3 weekly classes helps you and your labor partner prepare for childbirth. Newborn care, labor & birth, cesarean birth, pain management, and comfort techniques are discussed and a [place] [facility] is included. The class meets at the same time, on the same day of the week for 3 consecutive weeks beginning with the starting date you choose. $60 for registrant and one support person.  Marvelous Multiples: Expecting twins, triplets, or more? This class covers the differences in labor, birth, parenting, and breastfeeding issues that face multiples' parents. NICU tour is included. Led by a Certified Childbirth Educator who is the mother of twins. No fee. Caring for Baby: This class is for expectant and adoptive parents who want to learn and practice the most up-to-date newborn care for their babies. Focus is on birth through the first six weeks of life. Topics include feeding, bathing, diapering, crying, umbilical cord care, circumcision care and safe sleep. Parents learn to recognize symptoms of illness and when to call the pediatrician. Register only the mom-to-be and your partner or support person can plan to come with you! $10 per registrant and support person Childbirth Class- online option: This online class offers you the freedom to complete a Birth and Baby series in the comfort of your own home. The flexibility of  this option allows you to review sections at your own pace, at times convenient to you and your support people. It includes additional video information, animations, quizzes, and extended activities. Get organized with helpful eClass tools, checklists, and trackers. Once you register online for the class, you will  receive an email within a few days to accept the invitation and begin the class when the time is right for you. The content will be available to you for 60 days. $60 for 60 days of online access for you and your support people.  Local Doulas: Natural Baby Doulas [email or web address] Tel: [phone] [email or web address] [employer] [phone] [email or web address].[email or web address] The Labor Hassell Halim  (also do waterbirth tub rental) [phone] [email or web address] [facility] Birth Doula ([phone] [email or web address] NotebookDistributors.fi Sacred Rhythms  [phone] https://[email or web address]-[email or web address]/ [employer] (PADA) [email or web address] https://www.[email or web address]/ [place] Birth [facility] Baby  http://[email or web address]/ Considering Waterbirth? Guide for patients at [facility] [employer]  Why consider waterbirth?  . Gentle birth for babies . Less pain medicine used in labor . May allow for passive descent/less pushing . May reduce perineal tears  . More mobility and instinctive maternal position changes . Increased maternal relaxation . Reduced blood pressure in labor  Is waterbirth safe? What are the risks of infection, drowning or other complications?  . Infection: o Very low risk (3.7 % for tub vs 4.8% for bed) o 7 in 8000 waterbirths with documented infection o Poorly cleaned equipment most common cause o Slightly lower group B strep transmission rate  . Drowning o Maternal:  - Very low risk   - Related to seizures or fainting o Newborn:  - Very low risk. No evidence of increased risk of respiratory problems in multiple large studies - Physiological protection from breathing under water - Avoid underwater birth if there are any fetal complications - Once baby's head is out of the water, keep it out.  . Birth complication o Some  reports of cord trauma, but risk decreased by bringing baby to surface gradually o No evidence of increased risk of shoulder dystocia. Mothers can usually change positions faster in water than in a bed, possibly aiding the maneuvers to free the shoulder.   You must attend a Doren Custard class at [facility]  3rd Wednesday of every month from 7-9pm  [employer] by calling [phone] or online at [email or web address]  Bring [place] the certificate from the class to your prenatal appointment  Meet with a midwife at 36 weeks to see if you can still plan a waterbirth and to sign the consent.   Purchase or rent the following supplies:   Water Birth Pool (Birth Pool in a Box or [place] for instance)  (Tubs start ~$125)  Single-use disposable tub liner designed for your brand of tub  New garden hose labeled "lead-free", "suitable for drinking water",  Electric drain pump to remove water (We recommend 792 gallon per hour or greater pump.)   Separate garden hose to remove the dirty water  Fish net  Bathing suit top (optional)  Long-handled mirror (optional)  Places to purchase or rent supplies  GotWebTools.is for tub purchases and supplies  Waterbirthsolutions.com for tub purchases and supplies  The Labor Ladies (www.[email or web address]) $275 for tub rental/set-up & take down/kit   [employer] (http://www.fleming.com/.htm) Information regarding doulas (labor support) who provide pool rentals  Our practice has a Birth Pool in a Box tub at the hospital that you may borrow on a first-come-first-served basis. It is your responsibility to to set up, clean  and break down the tub. We cannot guarantee the availability of this tub in advance. You are responsible for bringing all accessories listed above. If you do not have all necessary supplies you cannot have a waterbirth.    Things that would prevent you from having a waterbirth:  Premature,  <37wks  Previous cesarean birth  Presence of thick meconium-stained fluid  Multiple gestation (Twins, triplets, etc.)  Uncontrolled diabetes or gestational diabetes requiring medication  Hypertension requiring medication or diagnosis of pre-eclampsia  Heavy vaginal bleeding  Non-reassuring fetal heart rate  Active infection (MRSA, etc.). Group B Strep is NOT a contraindication for  waterbirth.  If your labor has to be induced and induction method requires continuous  monitoring of the baby's heart rate  Other risks/issues identified by your obstetrical provider  Please remember that birth is unpredictable. Under certain unforeseeable circumstances your provider may advise against giving birth in the tub. These decisions will be made on a case-by-case basis and with the safety of you and your baby as our highest priority.

## 2017-10-03 NOTE — Progress Notes (Signed)
Subjective:     Patient ID: Kimberly Humphrey, female   DOB: 05/05/1991, 27 y.o.   MRN: [id number]  HPI Kimberly Humphrey is a 27 y.o. G1P0000 at 5537w1d being seen today for ongoing prenatal care. She is being monitored for low risk pregnancy and supervision on normal a normal progressing pregnancy.  At this time she does not report vaginal bleeding, vaginal leaking, or premature contractions. She states that she is able to feel fetal movements and there has been no change in movements.  Her only complaints at this appointment is some mild nausea, for which she is taking ginger chews, and some mild nasal congestion.   Review of Systems  Constitutional: Negative for appetite change.  HENT: Positive for congestion.   Gastrointestinal: Positive for nausea.  Endocrine: Negative for polydipsia.  Genitourinary: Negative for difficulty urinating, dysuria, pelvic pain, vaginal bleeding and vaginal discharge.       Objective:   Physical Exam  Constitutional: She is oriented to person, place, and time. She appears well-developed and well-nourished.  HENT:  Head: Normocephalic and atraumatic.  Cardiovascular: Normal rate, regular rhythm and normal heart sounds.  Abdominal: Soft.  Neurological: She is alert and oriented to person, place, and time.       Assessment:     -Surveillance of normal pregnancy      Plan:     -use Afrin as needed for nasal congestion - AFP -schedule US -repeat urine GC/chlymedia test -continue using Baby Scripts -follow up in 8 weeks for next prenatal visit and 2 hour GTT

## 2017-10-03 NOTE — Progress Notes (Signed)
   PRENATAL VISIT NOTE  Subjective:  Kimberly Humphrey is a 27 y.o. G1P0000 at 5559w1d being seen today for ongoing prenatal care.  She is currently monitored for the following issues for this low-risk pregnancy and has Supervision of normal first pregnancy, antepartum on their problem list.  Patient reports no complaints.  Contractions: Not present. Vag. Bleeding: None.  Movement: Present. Denies leaking of fluid.   The following portions of the patient's history were reviewed and updated as appropriate: allergies, current medications, past family history, past medical history, past social history, past surgical history and problem list. Problem list updated.  Objective:   Vitals:   10/03/17 0923  BP: 122/62  Pulse: 72  Weight: 136 lb 9.6 oz (62 kg)    Fetal Status: Fetal Heart Rate (bpm): 143 Fundal Height: 21 cm Movement: Present     General:  Alert, oriented and cooperative. Patient is in no acute distress.  Skin: Skin is warm and dry. No rash noted.   Cardiovascular: Normal heart rate noted  Respiratory: Normal respiratory effort, no problems with respiration noted  Abdomen: Soft, gravid, appropriate for gestational age.  Pain/Pressure: Present     Pelvic: Cervical exam deferred        Extremities: Normal range of motion.  Edema: None  Mental Status:  Normal mood and affect. Normal behavior. Normal judgment and thought content.   Assessment and Plan:  Pregnancy: G1P0000 at 6159w1d  1. Supervision of normal first pregnancy, antepartum  - US MFM OB FOLLOW UP; Future - AFP only - GC/CT, TOC today   Preterm labor symptoms and general obstetric precautions including but not limited to vaginal bleeding, contractions, leaking of fluid and fetal movement were reviewed in detail with the patient. Please refer to After Visit Summary for other counseling recommendations.  Return in about 8 weeks (around 11/28/2017).   Thressa ShellerHeather Hogan, CNM

## 2017-10-04 LAB — GC/CHLAMYDIA PROBE AMP (~~LOC~~) NOT AT ARMC
Chlamydia: NEGATIVE
Neisseria Gonorrhea: NEGATIVE

## 2017-10-05 LAB — AFP, SERUM, OPEN SPINA BIFIDA
AFP MoM: 1.13
AFP Value: 74.7 ng/mL
GEST. AGE ON COLLECTION DATE: 20.1 wk
MATERNAL AGE AT EDD: 25.2 a
OSBR RISK 1 IN: 10000
TEST RESULTS AFP: NEGATIVE
WEIGHT: 136 [lb_av]

## 2017-10-11 ENCOUNTER — Encounter: Payer: Self-pay | Admitting: Advanced Practice Midwife

## 2017-10-11 ENCOUNTER — Other Ambulatory Visit: Payer: Self-pay | Admitting: General Practice

## 2017-10-11 DIAGNOSIS — Z331 Pregnant state, incidental: Secondary | ICD-10-CM

## 2017-10-11 MED ORDER — PRENATAL 19 PO TABS: 1.0000 | ORAL_TABLET | Freq: Every day | ORAL | 11 refills | Status: AC

## 2017-10-22 ENCOUNTER — Encounter: Payer: Self-pay | Admitting: General Practice

## 2017-11-08 ENCOUNTER — Encounter: Payer: Self-pay | Admitting: General Practice

## 2017-11-12 ENCOUNTER — Other Ambulatory Visit: Payer: Self-pay | Admitting: Advanced Practice Midwife

## 2017-11-12 ENCOUNTER — Ambulatory Visit (HOSPITAL_COMMUNITY)
Admission: RE | Admit: 2017-11-12 | Discharge: 2017-11-12 | Disposition: A | Discharge status: Home / Self Care | Payer: Medicaid Other | Source: Ambulatory Visit | Attending: Advanced Practice Midwife | Admitting: Advanced Practice Midwife

## 2017-11-12 DIAGNOSIS — O283 Abnormal ultrasonic finding on antenatal screening of mother: Secondary | ICD-10-CM | POA: Insufficient documentation

## 2017-11-12 DIAGNOSIS — Z3A25 25 weeks gestation of pregnancy: Secondary | ICD-10-CM | POA: Diagnosis not present

## 2017-11-12 DIAGNOSIS — Z362 Encounter for other antenatal screening follow-up: Secondary | ICD-10-CM | POA: Diagnosis not present

## 2017-11-12 DIAGNOSIS — Z3400 Encounter for supervision of normal first pregnancy, unspecified trimester: Secondary | ICD-10-CM

## 2017-11-12 DIAGNOSIS — Z3A19 19 weeks gestation of pregnancy: Secondary | ICD-10-CM

## 2017-11-27 ENCOUNTER — Other Ambulatory Visit: Payer: Self-pay | Admitting: *Deleted

## 2017-11-27 DIAGNOSIS — Z3400 Encounter for supervision of normal first pregnancy, unspecified trimester: Secondary | ICD-10-CM

## 2017-11-28 ENCOUNTER — Other Ambulatory Visit: Payer: Medicaid Other

## 2017-11-28 ENCOUNTER — Ambulatory Visit (INDEPENDENT_AMBULATORY_CARE_PROVIDER_SITE_OTHER): Payer: Medicaid Other | Admitting: Advanced Practice Midwife

## 2017-11-28 VITALS — BP 123/66 | HR 72 | Wt 144.4 lb

## 2017-11-28 DIAGNOSIS — Z3400 Encounter for supervision of normal first pregnancy, unspecified trimester: Secondary | ICD-10-CM

## 2017-11-28 MED ORDER — TETANUS-DIPHTH-ACELL PERTUSSIS 5-2.5-18.5 LF-MCG/0.5 IM SUSP
0.5000 mL | Freq: Once | INTRAMUSCULAR | Status: AC
  Administered 2017-11-28: 0.5 mL via INTRAMUSCULAR

## 2017-11-28 NOTE — Progress Notes (Signed)
   PRENATAL VISIT NOTE  Subjective:  Kimberly Humphrey is a 27 y.o. G1P0000 at [redacted]w[redacted]d being seen today for ongoing prenatal care.  She is currently monitored for the following issues for this low-risk pregnancy and has Supervision of normal first pregnancy, antepartum on their problem list.  Patient reports no complaints.  Contractions: Irregular. Vag. Bleeding: None.  Movement: Present. Denies leaking of fluid.   The following portions of the patient's history were reviewed and updated as appropriate: allergies, current medications, past family history, past medical history, past social history, past surgical history and problem list. Problem list updated.  Objective:   Vitals:   11/28/17 0830  BP: 123/66  Pulse: 72  Weight: 144 lb 6.4 oz (65.5 kg)    Fetal Status: Fetal Heart Rate (bpm): 140 Fundal Height: 28 cm Movement: Present     General:  Alert, oriented and cooperative. Patient is in no acute distress.  Skin: Skin is warm and dry. No rash noted.   Cardiovascular: Normal heart rate noted  Respiratory: Normal respiratory effort, no problems with respiration noted  Abdomen: Soft, gravid, appropriate for gestational age.  Pain/Pressure: Absent     Pelvic: Cervical exam deferred        Extremities: Normal range of motion.  Edema: Trace  Mental Status: Normal mood and affect. Normal behavior. Normal judgment and thought content.   Assessment and Plan:  Pregnancy: G1P0000 at [redacted]w[redacted]d  1. Supervision of normal first pregnancy, antepartum --28 week labs done --TDAP discussed with pt and given --Babyscripts so next appt at 32 weeks  Preterm labor symptoms and general obstetric precautions including but not limited to vaginal bleeding, contractions, leaking of fluid and fetal movement were reviewed in detail with the patient. Please refer to After Visit Summary for other counseling recommendations.  No follow-ups on file.  No future appointments.  Sharen Counter, CNM

## 2017-11-28 NOTE — Patient Instructions (Signed)
Third Trimester of Pregnancy The third trimester is from week 28 through week 40 (months 7 through 9). The third trimester is a time when the unborn baby (fetus) is growing rapidly. At the end of the ninth month, the fetus is about 20 inches in length and weighs 6-10 pounds. Body changes during your third trimester Your body will continue to go through many changes during pregnancy. The changes vary from woman to woman. During the third trimester:  Your weight will continue to increase. You can expect to gain 25-35 pounds (11-16 kg) by the end of the pregnancy.  You may begin to get stretch marks on your hips, abdomen, and breasts.  You may urinate more often because the fetus is moving lower into your pelvis and pressing on your bladder.  You may develop or continue to have heartburn. This is caused by increased hormones that slow down muscles in the digestive tract.  You may develop or continue to have constipation because increased hormones slow digestion and cause the muscles that push waste through your intestines to relax.  You may develop hemorrhoids. These are swollen veins (varicose veins) in the rectum that can itch or be painful.  You may develop swollen, bulging veins (varicose veins) in your legs.  You may have increased body aches in the pelvis, back, or thighs. This is due to weight gain and increased hormones that are relaxing your joints.  You may have changes in your hair. These can include thickening of your hair, rapid growth, and changes in texture. Some women also have hair loss during or after pregnancy, or hair that feels dry or thin. Your hair will most likely return to normal after your baby is born.  Your breasts will continue to grow and they will continue to become tender. A yellow fluid (colostrum) may leak from your breasts. This is the first milk you are producing for your baby.  Your belly button may stick out.  You may notice more swelling in your hands,  face, or ankles.  You may have increased tingling or numbness in your hands, arms, and legs. The skin on your belly may also feel numb.  You may feel short of breath because of your expanding uterus.  You may have more problems sleeping. This can be caused by the size of your belly, increased need to urinate, and an increase in your body's metabolism.  You may notice the fetus "dropping," or moving lower in your abdomen (lightening).  You may have increased vaginal discharge.  You may notice your joints feel loose and you may have pain around your pelvic bone.  What to expect at prenatal visits You will have prenatal exams every 2 weeks until week 36. Then you will have weekly prenatal exams. During a routine prenatal visit:  You will be weighed to make sure you and the baby are growing normally.  Your blood pressure will be taken.  Your abdomen will be measured to track your baby's growth.  The fetal heartbeat will be listened to.  Any test results from the previous visit will be discussed.  You may have a cervical check near your due date to see if your cervix has softened or thinned (effaced).  You will be tested for Group B streptococcus. This happens between 35 and 37 weeks.  Your health care provider may ask you:  What your birth plan is.  How you are feeling.  If you are feeling the baby move.  If you have had   any abnormal symptoms, such as leaking fluid, bleeding, severe headaches, or abdominal cramping.  If you are using any tobacco products, including cigarettes, chewing tobacco, and electronic cigarettes.  If you have any questions.  Other tests or screenings that may be performed during your third trimester include:  Blood tests that check for low iron levels (anemia).  Fetal testing to check the health, activity level, and growth of the fetus. Testing is done if you have certain medical conditions or if there are problems during the  pregnancy.  Nonstress test (NST). This test checks the health of your baby to make sure there are no signs of problems, such as the baby not getting enough oxygen. During this test, a belt is placed around your belly. The baby is made to move, and its heart rate is monitored during movement.  What is false labor? False labor is a condition in which you feel small, irregular tightenings of the muscles in the womb (contractions) that usually go away with rest, changing position, or drinking water. These are called Braxton Hicks contractions. Contractions may last for hours, days, or even weeks before true labor sets in. If contractions come at regular intervals, become more frequent, increase in intensity, or become painful, you should see your health care provider. What are the signs of labor?  Abdominal cramps.  Regular contractions that start at 10 minutes apart and become stronger and more frequent with time.  Contractions that start on the top of the uterus and spread down to the lower abdomen and back.  Increased pelvic pressure and dull back pain.  A watery or bloody mucus discharge that comes from the vagina.  Leaking of amniotic fluid. This is also known as your "water breaking." It could be a slow trickle or a gush. Let your health care provider know if it has a color or strange odor. If you have any of these signs, call your health care provider right away, even if it is before your due date. Follow these instructions at home: Medicines  Follow your health care provider's instructions regarding medicine use. Specific medicines may be either safe or unsafe to take during pregnancy.  Take a prenatal vitamin that contains at least 600 micrograms (mcg) of folic acid.  If you develop constipation, try taking a stool softener if your health care provider approves. Eating and drinking  Eat a balanced diet that includes fresh fruits and vegetables, whole grains, good sources of protein  such as meat, eggs, or tofu, and low-fat dairy. Your health care provider will help you determine the amount of weight gain that is right for you.  Avoid raw meat and uncooked cheese. These carry germs that can cause birth defects in the baby.  If you have low calcium intake from food, talk to your health care provider about whether you should take a daily calcium supplement.  Eat four or five small meals rather than three large meals a day.  Limit foods that are high in fat and processed sugars, such as fried and sweet foods.  To prevent constipation: ? Drink enough fluid to keep your urine clear or pale yellow. ? Eat foods that are high in fiber, such as fresh fruits and vegetables, whole grains, and beans. Activity  Exercise only as directed by your health care provider. Most women can continue their usual exercise routine during pregnancy. Try to exercise for 30 minutes at least 5 days a week. Stop exercising if you experience uterine contractions.  Avoid heavy   lifting.  Do not exercise in extreme heat or humidity, or at high altitudes.  Wear low-heel, comfortable shoes.  Practice good posture.  You may continue to have sex unless your health care provider tells you otherwise. Relieving pain and discomfort  Take frequent breaks and rest with your legs elevated if you have leg cramps or low back pain.  Take warm sitz baths to soothe any pain or discomfort caused by hemorrhoids. Use hemorrhoid cream if your health care provider approves.  Wear a good support bra to prevent discomfort from breast tenderness.  If you develop varicose veins: ? Wear support pantyhose or compression stockings as told by your healthcare provider. ? Elevate your feet for 15 minutes, 3-4 times a day. Prenatal care  Write down your questions. Take them to your prenatal visits.  Keep all your prenatal visits as told by your health care provider. This is important. Safety  Wear your seat belt at  all times when driving.  Make a list of emergency phone numbers, including numbers for family, friends, the hospital, and police and fire departments. General instructions  Avoid cat litter boxes and soil used by cats. These carry germs that can cause birth defects in the baby. If you have a cat, ask someone to clean the litter box for you.  Do not travel far distances unless it is absolutely necessary and only with the approval of your health care provider.  Do not use hot tubs, steam rooms, or saunas.  Do not drink alcohol.  Do not use any products that contain nicotine or tobacco, such as cigarettes and e-cigarettes. If you need help quitting, ask your health care provider.  Do not use any medicinal herbs or unprescribed drugs. These chemicals affect the formation and growth of the baby.  Do not douche or use tampons or scented sanitary pads.  Do not cross your legs for long periods of time.  To prepare for the arrival of your baby: ? Take prenatal classes to understand, practice, and ask questions about labor and delivery. ? Make a trial run to the hospital. ? Visit the hospital and tour the maternity area. ? Arrange for maternity or paternity leave through employers. ? Arrange for family and friends to take care of pets while you are in the hospital. ? Purchase a rear-facing car seat and make sure you know how to install it in your car. ? Pack your hospital bag. ? Prepare the baby's nursery. Make sure to remove all pillows and stuffed animals from the baby's crib to prevent suffocation.  Visit your dentist if you have not gone during your pregnancy. Use a soft toothbrush to brush your teeth and be gentle when you floss. Contact a health care provider if:  You are unsure if you are in labor or if your water has broken.  You become dizzy.  You have mild pelvic cramps, pelvic pressure, or nagging pain in your abdominal area.  You have lower back pain.  You have persistent  nausea, vomiting, or diarrhea.  You have an unusual or bad smelling vaginal discharge.  You have pain when you urinate. Get help right away if:  Your water breaks before 37 weeks.  You have regular contractions less than 5 minutes apart before 37 weeks.  You have a fever.  You are leaking fluid from your vagina.  You have spotting or bleeding from your vagina.  You have severe abdominal pain or cramping.  You have rapid weight loss or weight gain.    You have shortness of breath with chest pain.  You notice sudden or extreme swelling of your face, hands, ankles, feet, or legs.  Your baby makes fewer than 10 movements in 2 hours.  You have severe headaches that do not go away when you take medicine.  You have vision changes. Summary  The third trimester is from week 28 through week 40, months 7 through 9. The third trimester is a time when the unborn baby (fetus) is growing rapidly.  During the third trimester, your discomfort may increase as you and your baby continue to gain weight. You may have abdominal, leg, and back pain, sleeping problems, and an increased need to urinate.  During the third trimester your breasts will keep growing and they will continue to become tender. A yellow fluid (colostrum) may leak from your breasts. This is the first milk you are producing for your baby.  False labor is a condition in which you feel small, irregular tightenings of the muscles in the womb (contractions) that eventually go away. These are called Braxton Hicks contractions. Contractions may last for hours, days, or even weeks before true labor sets in.  Signs of labor can include: abdominal cramps; regular contractions that start at 10 minutes apart and become stronger and more frequent with time; watery or bloody mucus discharge that comes from the vagina; increased pelvic pressure and dull back pain; and leaking of amniotic fluid. This information is not intended to replace advice  given to you by your health care provider. Make sure you discuss any questions you have with your health care provider. Document Released: 06/27/2001 Document Revised: 12/09/2015 Document Reviewed: 09/03/2012 Elsevier Interactive Patient Education  2017 [employer]  

## 2017-11-29 ENCOUNTER — Telehealth: Payer: Self-pay | Admitting: Advanced Practice Midwife

## 2017-11-29 ENCOUNTER — Other Ambulatory Visit: Payer: Self-pay | Admitting: Advanced Practice Midwife

## 2017-11-29 ENCOUNTER — Encounter: Payer: Self-pay | Admitting: Advanced Practice Midwife

## 2017-11-29 DIAGNOSIS — O99013 Anemia complicating pregnancy, third trimester: Secondary | ICD-10-CM | POA: Insufficient documentation

## 2017-11-29 LAB — GLUCOSE TOLERANCE, 2 HOURS W/ 1HR
Glucose, 1 hour: 92 mg/dL (ref 65–179)
Glucose, 2 hour: 66 mg/dL (ref 65–152)
Glucose, Fasting: 69 mg/dL (ref 65–91)

## 2017-11-29 LAB — CBC
Hematocrit: 28.5 % — ABNORMAL LOW (ref 34.0–46.6)
Hemoglobin: 9.4 g/dL — ABNORMAL LOW (ref 11.1–15.9)
MCH: 27.2 pg (ref 26.6–33.0)
MCHC: 33 g/dL (ref 31.5–35.7)
MCV: 83 fL (ref 79–97)
Platelets: 259 10*3/uL (ref 150–379)
RBC: 3.45 x10E6/uL — ABNORMAL LOW (ref 3.77–5.28)
RDW: 13.9 % (ref 12.3–15.4)
WBC: 7.8 10*3/uL (ref 3.4–10.8)

## 2017-11-29 LAB — RPR: RPR Ser Ql: NONREACTIVE

## 2017-11-29 LAB — HIV ANTIBODY (ROUTINE TESTING W REFLEX): HIV Screen 4th Generation wRfx: NONREACTIVE

## 2017-11-29 MED ORDER — FERROUS SULFATE 325 (65 FE) MG PO TABS: 325.0000 mg | ORAL_TABLET | Freq: Every day | ORAL | 2 refills | Status: DC

## 2017-11-29 NOTE — Telephone Encounter (Signed)
Pt hgb 9.4 down from 10.4 on 08/14/17, and 12.6 on 06/29/17.  Pt asymptomatic at prenatal visit. Recommend daily ferrous sulfate in addition to PNV.  Called pt but mailbox is full. Rx sent to pharmacy.

## 2017-11-29 NOTE — Telephone Encounter (Signed)
Pt returned call and was notified of anemia and Rx for iron at pharmacy. She stated understanding.

## 2017-11-29 NOTE — Progress Notes (Signed)
Ferrous sulfate Rx sent to pharmacy for anemia

## 2017-12-01 ENCOUNTER — Encounter: Payer: Self-pay | Admitting: Advanced Practice Midwife

## 2017-12-06 ENCOUNTER — Other Ambulatory Visit: Payer: Self-pay

## 2017-12-06 ENCOUNTER — Encounter (HOSPITAL_COMMUNITY): Payer: Self-pay | Admitting: *Deleted

## 2017-12-06 ENCOUNTER — Inpatient Hospital Stay (HOSPITAL_COMMUNITY): Payer: Medicaid Other

## 2017-12-06 ENCOUNTER — Inpatient Hospital Stay (HOSPITAL_COMMUNITY)
Admission: AD | Admit: 2017-12-06 | Discharge: 2017-12-07 | Disposition: A | Discharge status: Home / Self Care | Payer: Medicaid Other | Source: Ambulatory Visit | Attending: Obstetrics & Gynecology | Admitting: Obstetrics & Gynecology

## 2017-12-06 DIAGNOSIS — O99013 Anemia complicating pregnancy, third trimester: Secondary | ICD-10-CM | POA: Diagnosis not present

## 2017-12-06 DIAGNOSIS — O212 Late vomiting of pregnancy: Secondary | ICD-10-CM | POA: Diagnosis not present

## 2017-12-06 DIAGNOSIS — O4700 False labor before 37 completed weeks of gestation, unspecified trimester: Secondary | ICD-10-CM

## 2017-12-06 DIAGNOSIS — D649 Anemia, unspecified: Secondary | ICD-10-CM | POA: Diagnosis not present

## 2017-12-06 DIAGNOSIS — O4703 False labor before 37 completed weeks of gestation, third trimester: Secondary | ICD-10-CM | POA: Insufficient documentation

## 2017-12-06 DIAGNOSIS — O219 Vomiting of pregnancy, unspecified: Secondary | ICD-10-CM

## 2017-12-06 DIAGNOSIS — Z3A29 29 weeks gestation of pregnancy: Secondary | ICD-10-CM | POA: Diagnosis not present

## 2017-12-06 DIAGNOSIS — Z3400 Encounter for supervision of normal first pregnancy, unspecified trimester: Secondary | ICD-10-CM

## 2017-12-06 DIAGNOSIS — E860 Dehydration: Secondary | ICD-10-CM | POA: Diagnosis not present

## 2017-12-06 DIAGNOSIS — Z87891 Personal history of nicotine dependence: Secondary | ICD-10-CM | POA: Insufficient documentation

## 2017-12-06 DIAGNOSIS — O479 False labor, unspecified: Secondary | ICD-10-CM

## 2017-12-06 LAB — URINALYSIS, ROUTINE W REFLEX MICROSCOPIC
Bilirubin Urine: NEGATIVE
Glucose, UA: NEGATIVE mg/dL
Hgb urine dipstick: NEGATIVE
Ketones, ur: 80 mg/dL — AB
Nitrite: NEGATIVE
Protein, ur: 30 mg/dL — AB
SPECIFIC GRAVITY, URINE: 1.02 (ref 1.005–1.030)
pH: 5 (ref 5.0–8.0)

## 2017-12-06 LAB — WET PREP, GENITAL
Clue Cells Wet Prep HPF POC: NONE SEEN
SPERM: NONE SEEN
TRICH WET PREP: NONE SEEN
Yeast Wet Prep HPF POC: NONE SEEN

## 2017-12-06 LAB — RAPID URINE DRUG SCREEN, HOSP PERFORMED
Amphetamines: NOT DETECTED
Barbiturates: NOT DETECTED
Benzodiazepines: NOT DETECTED
Cocaine: NOT DETECTED
OPIATES: NOT DETECTED
Tetrahydrocannabinol: POSITIVE — AB

## 2017-12-06 MED ORDER — ONDANSETRON HCL 4 MG PO TABS: 4.0000 mg | ORAL_TABLET | Freq: Three times a day (TID) | ORAL | 2 refills | Status: DC | PRN

## 2017-12-06 MED ORDER — NIFEDIPINE 10 MG PO CAPS
10.0000 mg | ORAL_CAPSULE | ORAL | Status: AC
Start: 2017-12-06 — End: 2017-12-06
  Administered 2017-12-06 (×3): 10 mg via ORAL
  Filled 2017-12-06 (×3): qty 1

## 2017-12-06 MED ORDER — LACTATED RINGERS IV BOLUS
1000.0000 mL | Freq: Once | INTRAVENOUS | Status: AC
  Administered 2017-12-06: 1000 mL via INTRAVENOUS

## 2017-12-06 NOTE — MAU Note (Signed)
Pt presents with c/o ctxs intermittently since Sunday, but states have been occurring all day today.  Denies VB or LOF.  Reports +FM.

## 2017-12-06 NOTE — MAU Provider Note (Addendum)
History     CSN: [id number]  Arrival date and time: 12/06/17 2536   First Provider Initiated Contact with Patient 12/06/17 1910      Chief Complaint  Patient presents with  . Contractions   G1 .2 wks here with ctx. Ctx started 5 days ago. Frequency varies but happens daily, sometimes 2-3 over 2 hrs. Denies VB, vaginal discharge, or LOF. Good FM. Denies urinary sx. Admits to poor water intake.    OB History    Gravida  1   Para  0   Term  0   Preterm  0   AB  0   Living  0     SAB  0   TAB  0   Ectopic  0   Multiple  0   Live Births  0           Past Medical History:  Diagnosis Date  . Anemia     Past Surgical History:  Procedure Laterality Date  . NO PAST SURGERIES      History reviewed. No pertinent family history.  Social History   Tobacco Use  . Smoking status: Former Games developer  . Smokeless tobacco: Never Used  Substance Use Topics  . Alcohol use: No    Frequency: Never  . Drug use: No    Allergies: No Known Allergies  Medications Prior to Admission  Medication Sig Dispense Refill Last Dose  . ferrous sulfate (FERROUSUL) 325 (65 FE) MG tablet Take 1 tablet (325 mg total) by mouth daily with breakfast. 30 tablet 2   . metroNIDAZOLE (FLAGYL) 500 MG tablet Take 1 tablet (500 mg total) by mouth 2 (two) times daily. (Patient not taking: Reported on 10/03/2017) 14 tablet 0 Not Taking  . Prenatal Vit-DSS-Fe Fum-FA (PRENATAL 19) tablet Take 1 tablet by mouth daily. 30 tablet 11 Taking  . Prenatal Vit-Fe Fumarate-FA (MULTIVITAMIN-PRENATAL) 27-0.8 MG TABS tablet Take 1 tablet by mouth daily at 12 noon.   Taking  . terconazole (TERAZOL 7) 0.4 % vaginal cream Place 1 applicator vaginally at bedtime. (Patient not taking: Reported on 10/03/2017) 45 g 0 Not Taking    Review of Systems  Gastrointestinal: Positive for abdominal pain.  Genitourinary: Negative for vaginal bleeding and vaginal discharge.   Physical Exam   Blood pressure 127/64, pulse  100, temperature 98.9 F (37.2 C), temperature source Oral, resp. rate 20, height  (1.651 m), weight 140 lb 4 oz (63.6 kg), last menstrual period 05/15/2017, SpO2 100 %.  Physical Exam  Constitutional: She is oriented to person, place, and time. She appears well-developed and well-nourished. No distress.  HENT:  Head: Normocephalic and atraumatic.  Neck: Normal range of motion.  Respiratory: Effort normal. No respiratory distress.  GI: Soft. She exhibits no distension. There is no tenderness.  gravid  Genitourinary:  Genitourinary Comments: VE: closed/thick  Musculoskeletal: Normal range of motion.  Neurological: She is alert and oriented to person, place, and time.  Skin: Skin is warm and dry.  Psychiatric: She has a normal mood and affect.  EFM: 145 bpm, mod variability, + accels, no decels Toco: irregular w/irritability  Results for orders placed or performed during the hospital encounter of 12/06/17 (from the past 24 hour(s))  Urinalysis, Routine w reflex microscopic     Status: Abnormal   Collection Time: 12/06/17  6:44 PM  Result Value Ref Range   Color, Urine YELLOW YELLOW   APPearance HAZY (A) CLEAR   Specific Gravity, Urine 1.020 1.005 - 1.030  pH 5.0 5.0 - 8.0   Glucose, UA NEGATIVE NEGATIVE mg/dL   Hgb urine dipstick NEGATIVE NEGATIVE   Bilirubin Urine NEGATIVE NEGATIVE   Ketones, ur 80 (A) NEGATIVE mg/dL   Protein, ur 30 (A) NEGATIVE mg/dL   Nitrite NEGATIVE NEGATIVE   Leukocytes, UA SMALL (A) NEGATIVE   RBC / HPF 0-5 0 - 5 RBC/hpf   WBC, UA 0-5 0 - 5 WBC/hpf   Bacteria, UA RARE (A) NONE SEEN   Squamous Epithelial / LPF 0-5 0 - 5   Mucus PRESENT   Wet prep, genital     Status: Abnormal   Collection Time: 12/06/17  7:19 PM  Result Value Ref Range   Yeast Wet Prep HPF POC NONE SEEN NONE SEEN   Trich, Wet Prep NONE SEEN NONE SEEN   Clue Cells Wet Prep HPF POC NONE SEEN NONE SEEN   WBC, Wet Prep HPF POC MODERATE (A) NONE SEEN   Sperm NONE SEEN    MAU  Course  Procedures Procardia Po hydrate  MDM Labs and [place] ordered.  Transfer of care given to Leftwich-Kirby, CNM Donette Larry, CNM  12/06/2017 8:06 PM    Assessment and Plan    MDM: Limited OB US with normal findings, cervical length 3.13 cm Pt with episodes of frequent contractions but cervix remains closed x 3+ hours in MAU so no evidence of preterm labor IV fluids and Procardia given with some improvement and pt report of improvement in contraction pain Pt reports nausea/vomiting 1-2 times daily for last few days so will D/C home with Rx for Zofran 4 mg PO Q 8 hours PRN for nausea Increase PO fluids F/U in office as scheduled or sooner if symptoms persist Return to MAU as needed for emergencies   A: 1. Threatened preterm labor, third trimester   2. Anemia affecting pregnancy in third trimester   3. Supervision of normal first pregnancy, antepartum   4. Preterm contractions   5. Braxton Hicks contractions   6. Nausea and vomiting in pregnancy   7. Mild dehydration     P: D/C home with preterm labor precautions  Sharen Counter, CNM 12:11 AM

## 2017-12-26 ENCOUNTER — Ambulatory Visit (INDEPENDENT_AMBULATORY_CARE_PROVIDER_SITE_OTHER): Payer: Medicaid Other | Admitting: Medical

## 2017-12-26 ENCOUNTER — Encounter: Payer: Self-pay | Admitting: Medical

## 2017-12-26 VITALS — BP 118/65 | HR 63 | Wt 143.6 lb

## 2017-12-26 DIAGNOSIS — Z3403 Encounter for supervision of normal first pregnancy, third trimester: Secondary | ICD-10-CM

## 2017-12-26 DIAGNOSIS — O99013 Anemia complicating pregnancy, third trimester: Secondary | ICD-10-CM

## 2017-12-26 DIAGNOSIS — D649 Anemia, unspecified: Secondary | ICD-10-CM

## 2017-12-26 DIAGNOSIS — Z3400 Encounter for supervision of normal first pregnancy, unspecified trimester: Secondary | ICD-10-CM

## 2017-12-26 NOTE — Patient Instructions (Addendum)
Research childbirth classes and hospital preregistration at [email or web address]  Fetal Movement Counts Patient Name: ________________________________________________ Patient Due Date: ____________________ What is a fetal movement count? A fetal movement count is the number of times that you feel your baby move during a certain amount of time. This may also be called a fetal kick count. A fetal movement count is recommended for every pregnant woman. You may be asked to start counting fetal movements as early as week 28 of your pregnancy. Pay attention to when your baby is most active. You may notice your baby's sleep and wake cycles. You may also notice things that make your baby move more. You should do a fetal movement count:  When your baby is normally most active.  At the same time each day.  A good time to count movements is while you are resting, after having something to eat and drink. How do I count fetal movements? 1. Find a quiet, comfortable area. Sit, or lie down on your side. 2. Write down the date, the start time and stop time, and the number of movements that you felt between those two times. Take this information with you to your health care visits. 3. For 2 hours, count kicks, flutters, swishes, rolls, and jabs. You should feel at least 10 movements during 2 hours. 4. You may stop counting after you have felt 10 movements. 5. If you do not feel 10 movements in 2 hours, have something to eat and drink. Then, keep resting and counting for 1 hour. If you feel at least 4 movements during that hour, you may stop counting. Contact a health care provider if:  You feel fewer than 4 movements in 2 hours.  Your baby is not moving like he or she usually does. Date: ____________ Start time: ____________ Stop time: ____________ Movements: ____________ Date: ____________ Start time: ____________ Stop time: ____________ Movements: ____________ Date: ____________ Start time: ____________  Stop time: ____________ Movements: ____________ Date: ____________ Start time: ____________ Stop time: ____________ Movements: ____________ Date: ____________ Start time: ____________ Stop time: ____________ Movements: ____________ Date: ____________ Start time: ____________ Stop time: ____________ Movements: ____________ Date: ____________ Start time: ____________ Stop time: ____________ Movements: ____________ Date: ____________ Start time: ____________ Stop time: ____________ Movements: ____________ Date: ____________ Start time: ____________ Stop time: ____________ Movements: ____________ This information is not intended to replace advice given to you by your health care provider. Make sure you discuss any questions you have with your health care provider. Document Released: 08/02/2006 Document Revised: 03/01/2016 Document Reviewed: 08/12/2015 Elsevier Interactive Patient Education  2018 [employer]  Braxton Hicks Contractions Contractions of the uterus can occur throughout pregnancy, but they are not always a sign that you are in labor. You may have practice contractions called Braxton Hicks contractions. These false labor contractions are sometimes confused with true labor. What are Braxton Hicks contractions? Braxton Hicks contractions are tightening movements that occur in the muscles of the uterus before labor. Unlike true labor contractions, these contractions do not result in opening (dilation) and thinning of the cervix. Toward the end of pregnancy (32-34 weeks), Braxton Hicks contractions can happen more often and may become stronger. These contractions are sometimes difficult to tell apart from true labor because they can be very uncomfortable. You should not feel embarrassed if you go to the hospital with false labor. Sometimes, the only way to tell if you are in true labor is for your health care provider to look for changes in the cervix. The health care provider will   do a physical  exam and may monitor your contractions. If you are not in true labor, the exam should show that your cervix is not dilating and your water has not broken. If there are other health problems associated with your pregnancy, it is completely safe for you to be sent home with false labor. You may continue to have Braxton Hicks contractions until you go into true labor. How to tell the difference between true labor and false labor True labor  Contractions last 30-70 seconds.  Contractions become very regular.  Discomfort is usually felt in the top of the uterus, and it spreads to the lower abdomen and low back.  Contractions do not go away with walking.  Contractions usually become more intense and increase in frequency.  The cervix dilates and gets thinner. False labor  Contractions are usually shorter and not as strong as true labor contractions.  Contractions are usually irregular.  Contractions are often felt in the front of the lower abdomen and in the groin.  Contractions may go away when you walk around or change positions while lying down.  Contractions get weaker and are shorter-lasting as time goes on.  The cervix usually does not dilate or become thin. Follow these instructions at home:  Take over-the-counter and prescription medicines only as told by your health care provider.  Keep up with your usual exercises and follow other instructions from your health care provider.  Eat and drink lightly if you think you are going into labor.  If Braxton Hicks contractions are making you uncomfortable: ? Change your position from lying down or resting to walking, or change from walking to resting. ? Sit and rest in a tub of warm water. ? Drink enough fluid to keep your urine pale yellow. Dehydration may cause these contractions. ? Do slow and deep breathing several times an hour.  Keep all follow-up prenatal visits as told by your health care provider. This is  important. Contact a health care provider if:  You have a fever.  You have continuous pain in your abdomen. Get help right away if:  Your contractions become stronger, more regular, and closer together.  You have fluid leaking or gushing from your vagina.  You pass blood-tinged mucus (bloody show).  You have bleeding from your vagina.  You have low back pain that you never had before.  You feel your baby's head pushing down and causing pelvic pressure.  Your baby is not moving inside you as much as it used to. Summary  Contractions that occur before labor are called Braxton Hicks contractions, false labor, or practice contractions.  Braxton Hicks contractions are usually shorter, weaker, farther apart, and less regular than true labor contractions. True labor contractions usually become progressively stronger and regular and they become more frequent.  Manage discomfort from [facility] contractions by changing position, resting in a warm bath, drinking plenty of water, or practicing deep breathing. This information is not intended to replace advice given to you by your health care provider. Make sure you discuss any questions you have with your health care provider. Document Released: 11/16/2016 Document Revised: 11/16/2016 Document Reviewed: 11/16/2016 Elsevier Interactive Patient Education  2018 [employer]   BENEFITS OF BREASTFEEDING Many women wonder if they should breastfeed. Research shows that breast milk contains the perfect balance of vitamins, protein and fat that your baby needs to grow. It also contains antibodies that help your baby's immune system to fight off viruses and bacteria and can reduce  the risk of sudden infant death syndrome (SIDS). In addition, the colostrum (a fluid secreted from the breast in the first few days after delivery) helps your newborn's digestive system to grow and function well. Breast milk is easier to digest than formula. Also, if  your baby is born preterm, breast milk can help to reduce both short- and long-term health problems. BENEFITS OF BREASTFEEDING FOR MOM . Breastfeeding causes a hormone to be released that helps the uterus to contract and return to its normal size more quickly. . It aids in postpartum weight loss, reduces risk of breast and ovarian cancer, heart disease and rheumatoid arthritis. . It decreases the amount of bleeding after the baby is born. benefits of breastfeeding for baby . Provides comfort and nutrition . Protects baby against - Obesity - Diabetes - Asthma - Childhood cancers - Heart disease - Ear infections - Diarrhea - Pneumonia - Stomach problems - Serious allergies - Skin rashes . Promotes growth and development . Reduces the risk of baby having Sudden Infant Death Syndrome (SIDS) only breastmilk for the first 6 months . Protects baby against diseases/allergies . It's the perfect amount for tiny bellies . It restores baby's energy . Provides the best nutrition for baby . Giving water or formula can make baby more likely to get sick, decrease Mom's milk supply, make baby less content with breastfeeding Skin to Skin After delivery, the staff will place your baby on your chest. This helps with the following: . Regulates baby's temperature, breathing, heart rate and blood sugar . Increases Mom's milk supply . Promotes bonding . Keeps baby and Mom calm and decreases baby's crying Rooming In Your baby will stay in your room with you for the entire time you are in the hospital. This helps with the following: . Allows Mom to learn baby's feeding cues - Fluttering eyes - Sucking on tongue or hand - Rooting (opens mouth and turns head) - Nuzzling into the breast - Bringing hand to mouth . Allows breastfeeding on demand (when your baby is ready) . Helps baby to be calm and content . Ensures a good milk supply . Prevents complications with breastfeeding . Allows parents to  learn to care for baby . Allows you to request assistance with breastfeeding Importance of a good latch . Increases milk transfer to baby - baby gets enough milk . Ensures you have enough milk for your baby . Decreases nipple soreness . Don't use pacifiers and bottles - these cause baby to suck differently than breastfeeding . Promotes continuation of breastfeeding Risks of Formula Supplementation with Breastfeeding Giving your infant formula in addition to your breast-milk EXCEPT when medically necessary can lead to: Marland Kitchen Decreases your milk supply  . Loss of confidence in yourself for providing baby's nutrition  . Engorgement and possibly mastitis  . Asthma & allergies in the baby BREASTFEEDING FAQS How long should I breastfeed my baby? It is recommended that you provide your baby with breast milk only for the first 6 months and then continue for the first year and longer as desired. During the first few weeks after birth, your baby will need to feed 8-12 times every 24 hours, or every 2-3 hours. They will likely feed for 15-30 minutes. How can I help my baby begin breastfeeding? Babies are born with an instinct to breastfeed. A healthy baby can begin breastfeeding right away without specific help. At the hospital, a nurse (or lactation consultant) will help you begin the process and will give you  tips on good positioning. It may be helpful to take a breastfeeding class before you deliver in order to know what to expect. How can I help my baby latch on? In order to assist your baby in latching-on, cup your breast in your hand and stroke your baby's lower lip with your nipple to stimulate your baby's rooting reflex. Your baby will look like he or she is yawning, at which point you should bring the baby towards your breast, while aiming the nipple at the roof of his or her mouth. Remember to bring the baby towards you and not your breast towards the baby. How can I tell if my baby is  latched-on? Your baby will have all of your nipple and part of the dark area around the nipple in his or her mouth and your baby's nose will be touching your breast. You should see or hear the baby swallowing. If the baby is not latched-on properly, start the process over. To remove the suction, insert a clean finger between your breast and the baby's mouth. Should I switch breasts during feeding? After feeding on one side, switch the baby to your other breast. If he or she does not continue feeding - that is OK. Your baby will not necessarily need to feed from both breasts in a single feeding. On the next feeding, start with the other breast for efficiency and comfort. How can I tell if my baby is hungry? When your baby is hungry, they will nuzzle against your breast, make sucking noises and tongue motions and may put their hands near their mouth. Crying is a late sign of hunger, so you should not wait until this point. When they have received enough milk, they will unlatch from the breast. Is it okay to use a pacifier? Until your baby gets the hang of breastfeeding, experts recommend limiting pacifier usage. If you have questions about this, please contact your pediatrician. What can I do to ensure proper nutrition while breastfeeding? . Make sure that you support your own health and your baby's by eating a healthy, well-balanced diet . Your provider may recommend that you continue to take your prenatal vitamin . Drink plenty of fluids. It is a good rule to drink one glass of water before or after feeding . Alcohol will remain in the breast milk for as long as it will remain in the blood stream. If you choose to have a drink, it is recommended that you wait at least 2 hours before feeding . Moderate amounts of caffeine are OK . Some over-the-counter or prescription medications are not recommended during breastfeeding. Check with your provider if you have questions What types of birth control methods  are safe while breastfeeding? Progestin-only methods, including a daily pill, an IUD, the implant and the injection are safe while breastfeeding. Methods that contain estrogen (such as combination birth control pills, the vaginal ring and the patch) should not be used during the first month of breastfeeding as these can decrease your milk supply.

## 2017-12-26 NOTE — Progress Notes (Signed)
   PRENATAL VISIT NOTE  Subjective:  Kimberly Humphrey is a 27 y.o. G1P0000 at 6871w1d being seen today for ongoing prenatal care.  She is currently monitored for the following issues for this low-risk pregnancy and has Supervision of normal first pregnancy, antepartum and Anemia affecting pregnancy in third trimester on their problem list.  Patient reports no complaints.  Contractions: Irritability. Vag. Bleeding: None.  Movement: Present. Denies leaking of fluid.   The following portions of the patient's history were reviewed and updated as appropriate: allergies, current medications, past family history, past medical history, past social history, past surgical history and problem list. Problem list updated.  Objective:   Vitals:   12/26/17 1135  BP: 118/65  Pulse: 63  Weight: 143 lb 9.6 oz (65.1 kg)    Fetal Status: Fetal Heart Rate (bpm): 135 Fundal Height: 32 cm Movement: Present     General:  Alert, oriented and cooperative. Patient is in no acute distress.  Skin: Skin is warm and dry. No rash noted.   Cardiovascular: Normal heart rate noted  Respiratory: Normal respiratory effort, no problems with respiration noted  Abdomen: Soft, gravid, appropriate for gestational age.  Pain/Pressure: Absent     Pelvic: Cervical exam deferred        Extremities: Normal range of motion.  Edema: None  Mental Status: Normal mood and affect. Normal behavior. Normal judgment and thought content.   Assessment and Plan:  Pregnancy: G1P0000 at 4271w1d  1. Supervision of normal first pregnancy, antepartum - Seen in [facility] 2 weeks ago with contraction, N/V - symptoms have resolved - Non-compliant with baby rx optimization. States she would like to stay on optimized schedule and will set an alarm to enter weight and BP.   2. Anemia affecting pregnancy in third trimester - Taking Ferrous Sulfate. Will recheck CBC at next visit   Preterm labor symptoms and general obstetric precautions including but not  limited to vaginal bleeding, contractions, leaking of fluid and fetal movement were reviewed in detail with the patient. Please refer to After Visit Summary for other counseling recommendations.  Return in about 1 month (around 01/23/2018) for LOB, Babyscripts.  Vonzella NippleJulie Wenzel, PA-C

## 2018-01-07 ENCOUNTER — Encounter: Payer: Self-pay | Admitting: Advanced Practice Midwife

## 2018-01-20 ENCOUNTER — Encounter: Payer: Self-pay | Admitting: Advanced Practice Midwife

## 2018-01-23 ENCOUNTER — Inpatient Hospital Stay (HOSPITAL_COMMUNITY)
Admission: AD | Admit: 2018-01-23 | Discharge: 2018-01-23 | Disposition: A | Discharge status: Home / Self Care | Payer: Medicaid Other | Source: Ambulatory Visit | Attending: Family Medicine | Admitting: Family Medicine

## 2018-01-23 ENCOUNTER — Encounter (HOSPITAL_COMMUNITY): Payer: Self-pay

## 2018-01-23 DIAGNOSIS — O479 False labor, unspecified: Secondary | ICD-10-CM | POA: Insufficient documentation

## 2018-01-23 DIAGNOSIS — Z3A00 Weeks of gestation of pregnancy not specified: Secondary | ICD-10-CM | POA: Diagnosis not present

## 2018-01-23 NOTE — MAU Note (Signed)
I have communicated with Philipp DeputyKim Shaw CNM and reviewed vital signs:  Vitals:   01/23/18 0346  BP: 129/70  Pulse: (!) 55  Resp: 18  Temp: 97.7 F (36.5 C)  SpO2: 100%    Vaginal exam:  Dilation: 1.5 Effacement (%): 50 Cervical Position: Posterior Station: -2 Presentation: Vertex Exam by:: Camelia Enganielle Kemond Amorin RN,   Also reviewed contraction pattern and that non-stress test is reactive.  It has been documented that patient is contracting every 1.5-5 minutes with minimal cervical change over 1 hour not indicating active labor.  Patient denies any other complaints.  Based on this report provider has given order for discharge.  A discharge order and diagnosis entered by a provider.   Labor discharge instructions reviewed with patient.

## 2018-01-23 NOTE — MAU Note (Addendum)
Pt reports worsening contractions every couple of minutes x2 hours after intercourse. Pt denies LOF or vaginal bleeding. Reports good fetal movement. Cervix closed on last exam.

## 2018-01-23 NOTE — Discharge Instructions (Signed)
Braxton Hicks Contractions °Contractions of the uterus can occur throughout pregnancy, but they are not always a sign that you are in labor. You may have practice contractions called Braxton Hicks contractions. These false labor contractions are sometimes confused with true labor. °What are Braxton Hicks contractions? °Braxton Hicks contractions are tightening movements that occur in the muscles of the uterus before labor. Unlike true labor contractions, these contractions do not result in opening (dilation) and thinning of the cervix. Toward the end of pregnancy (32-34 weeks), Braxton Hicks contractions can happen more often and may become stronger. These contractions are sometimes difficult to tell apart from true labor because they can be very uncomfortable. You should not feel embarrassed if you go to the hospital with false labor. °Sometimes, the only way to tell if you are in true labor is for your health care provider to look for changes in the cervix. The health care provider will do a physical exam and may monitor your contractions. If you are not in true labor, the exam should show that your cervix is not dilating and your water has not broken. °If there are other health problems associated with your pregnancy, it is completely safe for you to be sent home with false labor. You may continue to have Braxton Hicks contractions until you go into true labor. °How to tell the difference between true labor and false labor °True labor °· Contractions last 30-70 seconds. °· Contractions become very regular. °· Discomfort is usually felt in the top of the uterus, and it spreads to the lower abdomen and low back. °· Contractions do not go away with walking. °· Contractions usually become more intense and increase in frequency. °· The cervix dilates and gets thinner. °False labor °· Contractions are usually shorter and not as strong as true labor contractions. °· Contractions are usually irregular. °· Contractions  are often felt in the front of the lower abdomen and in the groin. °· Contractions may go away when you walk around or change positions while lying down. °· Contractions get weaker and are shorter-lasting as time goes on. °· The cervix usually does not dilate or become thin. °Follow these instructions at home: °· Take over-the-counter and prescription medicines only as told by your health care provider. °· Keep up with your usual exercises and follow other instructions from your health care provider. °· Eat and drink lightly if you think you are going into labor. °· If Braxton Hicks contractions are making you uncomfortable: °? Change your position from lying down or resting to walking, or change from walking to resting. °? Sit and rest in a tub of warm water. °? Drink enough fluid to keep your urine pale yellow. Dehydration may cause these contractions. °? Do slow and deep breathing several times an hour. °· Keep all follow-up prenatal visits as told by your health care provider. This is important. °Contact a health care provider if: °· You have a fever. °· You have continuous pain in your abdomen. °Get help right away if: °· Your contractions become stronger, more regular, and closer together. °· You have fluid leaking or gushing from your vagina. °· You pass blood-tinged mucus (bloody show). °· You have bleeding from your vagina. °· You have low back pain that you never had before. °· You feel your baby’s head pushing down and causing pelvic pressure. °· Your baby is not moving inside you as much as it used to. °Summary °· Contractions that occur before labor are called Braxton   Hicks contractions, false labor, or practice contractions. °· Braxton Hicks contractions are usually shorter, weaker, farther apart, and less regular than true labor contractions. True labor contractions usually become progressively stronger and regular and they become more frequent. °· Manage discomfort from Braxton Hicks contractions by  changing position, resting in a warm bath, drinking plenty of water, or practicing deep breathing. °This information is not intended to replace advice given to you by your health care provider. Make sure you discuss any questions you have with your health care provider. °Document Released: 11/16/2016 Document Revised: 11/16/2016 Document Reviewed: 11/16/2016 °Elsevier Interactive Patient Education © 2018 [employer] ° °

## 2018-01-24 ENCOUNTER — Encounter: Payer: Self-pay | Admitting: Nurse Practitioner

## 2018-01-24 ENCOUNTER — Encounter: Payer: Medicaid Other | Admitting: Nurse Practitioner

## 2018-02-04 ENCOUNTER — Ambulatory Visit (INDEPENDENT_AMBULATORY_CARE_PROVIDER_SITE_OTHER): Payer: Medicaid Other | Admitting: Obstetrics & Gynecology

## 2018-02-04 ENCOUNTER — Other Ambulatory Visit (HOSPITAL_COMMUNITY)
Admission: RE | Admit: 2018-02-04 | Discharge: 2018-02-04 | Disposition: A | Discharge status: Home / Self Care | Payer: Medicaid Other | Source: Ambulatory Visit | Attending: Obstetrics & Gynecology | Admitting: Obstetrics & Gynecology

## 2018-02-04 VITALS — BP 133/70 | HR 54 | Wt 149.1 lb

## 2018-02-04 DIAGNOSIS — Z3400 Encounter for supervision of normal first pregnancy, unspecified trimester: Secondary | ICD-10-CM | POA: Diagnosis not present

## 2018-02-04 DIAGNOSIS — Z3A00 Weeks of gestation of pregnancy not specified: Secondary | ICD-10-CM | POA: Insufficient documentation

## 2018-02-04 DIAGNOSIS — O99013 Anemia complicating pregnancy, third trimester: Secondary | ICD-10-CM

## 2018-02-04 LAB — OB RESULTS CONSOLE GC/CHLAMYDIA: Gonorrhea: NEGATIVE

## 2018-02-04 LAB — OB RESULTS CONSOLE GBS: GBS: POSITIVE

## 2018-02-04 MED ORDER — FERROUS SULFATE 325 (65 FE) MG PO TABS: 325.0000 mg | ORAL_TABLET | Freq: Every day | ORAL | 2 refills | Status: AC

## 2018-02-04 NOTE — Progress Notes (Signed)
   PRENATAL VISIT NOTE  Subjective:  Kimberly MayersKeydra M Humphrey is a 27 y.o. G1P0000 at 7827w6d being seen today for ongoing prenatal care.  She is currently monitored for the following issues for this low-risk pregnancy and has Supervision of normal first pregnancy, antepartum and Anemia affecting pregnancy in third trimester on their problem list.  Patient reports no complaints.  Contractions: Irritability. Vag. Bleeding: None.  Movement: Present. Denies leaking of fluid.   The following portions of the patient's history were reviewed and updated as appropriate: allergies, current medications, past family history, past medical history, past social history, past surgical history and problem list. Problem list updated.  Objective:   Vitals:   02/04/18 0843  BP: 133/70  Pulse: (!) 54  Weight: 149 lb 1.6 oz (67.6 kg)    Fetal Status: Fetal Heart Rate (bpm): 138 Fundal Height: 36 cm Movement: Present     General:  Alert, oriented and cooperative. Patient is in no acute distress.  Skin: Skin is warm and dry. No rash noted.   Cardiovascular: Normal heart rate noted  Respiratory: Normal respiratory effort, no problems with respiration noted  Abdomen: Soft, gravid, appropriate for gestational age.  Pain/Pressure: Absent     Pelvic: Cervical exam performed        Extremities: Normal range of motion.  Edema: None  Mental Status: Normal mood and affect. Normal behavior. Normal judgment and thought content.   Assessment and Plan:  Pregnancy: G1P0000 at 4127w6d  1. Supervision of normal first pregnancy, antepartum  - Culture, beta strep (group b only) - Cervicovaginal ancillary only  2. Anemia affecting pregnancy in third trimester - iron refilled - CBC  Term labor symptoms and general obstetric precautions including but not limited to vaginal bleeding, contractions, leaking of fluid and fetal movement were reviewed in detail with the patient. Please refer to After Visit Summary for other  counseling recommendations.  Return in about 1 week (around 02/11/2018).  Future Appointments  Date Time Provider Department Center  02/11/2018  3:35 PM Willodean RosenthalHarraway-Smith, Carolyn, MD [facility]  02/18/2018 10:15 AM Calvert CantorWeinhold, Samantha C, CNM [facility]  02/25/2018 10:15 AM Armando ReichertHogan, Heather D, CNM [facility]    Allie BossierMyra C Jewell Haught, MD

## 2018-02-05 LAB — CERVICOVAGINAL ANCILLARY ONLY
Chlamydia: NEGATIVE
NEISSERIA GONORRHEA: NEGATIVE

## 2018-02-07 ENCOUNTER — Encounter: Payer: Self-pay | Admitting: Obstetrics & Gynecology

## 2018-02-07 DIAGNOSIS — O99820 Streptococcus B carrier state complicating pregnancy: Secondary | ICD-10-CM | POA: Insufficient documentation

## 2018-02-07 LAB — CBC
Hematocrit: 30.3 % — ABNORMAL LOW (ref 34.0–46.6)
Hemoglobin: 9.8 g/dL — ABNORMAL LOW (ref 11.1–15.9)
MCH: 26.4 pg — ABNORMAL LOW (ref 26.6–33.0)
MCHC: 32.3 g/dL (ref 31.5–35.7)
MCV: 82 fL (ref 79–97)
PLATELETS: 244 10*3/uL (ref 150–450)
RBC: 3.71 x10E6/uL — AB (ref 3.77–5.28)
RDW: 14.6 % (ref 12.3–15.4)
WBC: 9.2 10*3/uL (ref 3.4–10.8)

## 2018-02-07 LAB — CULTURE, BETA STREP (GROUP B ONLY): STREP GP B CULTURE: POSITIVE — AB

## 2018-02-11 ENCOUNTER — Inpatient Hospital Stay (HOSPITAL_COMMUNITY): Payer: Medicaid Other | Admitting: Anesthesiology

## 2018-02-11 ENCOUNTER — Encounter: Payer: Medicaid Other | Admitting: Obstetrics & Gynecology

## 2018-02-11 ENCOUNTER — Inpatient Hospital Stay (HOSPITAL_COMMUNITY)
Admission: AD | Admit: 2018-02-11 | Discharge: 2018-02-13 | DRG: 807 | Disposition: A | Discharge status: Home / Self Care | Payer: Medicaid Other | Attending: Obstetrics and Gynecology | Admitting: Obstetrics and Gynecology

## 2018-02-11 ENCOUNTER — Encounter (HOSPITAL_COMMUNITY): Payer: Self-pay

## 2018-02-11 DIAGNOSIS — O99013 Anemia complicating pregnancy, third trimester: Secondary | ICD-10-CM

## 2018-02-11 DIAGNOSIS — O99820 Streptococcus B carrier state complicating pregnancy: Secondary | ICD-10-CM

## 2018-02-11 DIAGNOSIS — Z3A38 38 weeks gestation of pregnancy: Secondary | ICD-10-CM

## 2018-02-11 DIAGNOSIS — D649 Anemia, unspecified: Secondary | ICD-10-CM | POA: Diagnosis present

## 2018-02-11 DIAGNOSIS — O9902 Anemia complicating childbirth: Secondary | ICD-10-CM | POA: Diagnosis present

## 2018-02-11 DIAGNOSIS — O99824 Streptococcus B carrier state complicating childbirth: Secondary | ICD-10-CM

## 2018-02-11 DIAGNOSIS — Z87891 Personal history of nicotine dependence: Secondary | ICD-10-CM

## 2018-02-11 DIAGNOSIS — Z3483 Encounter for supervision of other normal pregnancy, third trimester: Secondary | ICD-10-CM | POA: Diagnosis present

## 2018-02-11 DIAGNOSIS — Z3400 Encounter for supervision of normal first pregnancy, unspecified trimester: Secondary | ICD-10-CM

## 2018-02-11 LAB — CBC
HCT: 32.9 % — ABNORMAL LOW (ref 36.0–46.0)
Hemoglobin: 11 g/dL — ABNORMAL LOW (ref 12.0–15.0)
MCH: 27.3 pg (ref 26.0–34.0)
MCHC: 33.4 g/dL (ref 30.0–36.0)
MCV: 81.6 fL (ref 78.0–100.0)
PLATELETS: 307 10*3/uL (ref 150–400)
RBC: 4.03 MIL/uL (ref 3.87–5.11)
RDW: 14.4 % (ref 11.5–15.5)
WBC: 12.4 10*3/uL — ABNORMAL HIGH (ref 4.0–10.5)

## 2018-02-11 LAB — TYPE AND SCREEN
ABO/RH(D): B POS
Antibody Screen: NEGATIVE

## 2018-02-11 MED ORDER — OXYCODONE-ACETAMINOPHEN 5-325 MG PO TABS: 1.0000 | ORAL_TABLET | ORAL | Status: DC | PRN

## 2018-02-11 MED ORDER — SODIUM CHLORIDE 0.9 % IV SOLN
2.0000 g | Freq: Four times a day (QID) | INTRAVENOUS | Status: DC
  Administered 2018-02-11 (×2): 2 g via INTRAVENOUS
  Filled 2018-02-11 (×2): qty 2000
  Filled 2018-02-11: qty 2
  Filled 2018-02-11: qty 2000
  Filled 2018-02-11: qty 2

## 2018-02-11 MED ORDER — ONDANSETRON HCL 4 MG/2ML IJ SOLN: 4.0000 mg | Freq: Four times a day (QID) | INTRAMUSCULAR | Status: DC | PRN

## 2018-02-11 MED ORDER — LIDOCAINE HCL (PF) 1 % IJ SOLN
30.0000 mL | INTRAMUSCULAR | Status: DC | PRN
  Filled 2018-02-11: qty 30

## 2018-02-11 MED ORDER — PHENYLEPHRINE 40 MCG/ML (10ML) SYRINGE FOR IV PUSH (FOR BLOOD PRESSURE SUPPORT)
80.0000 ug | PREFILLED_SYRINGE | INTRAVENOUS | Status: DC | PRN
  Filled 2018-02-11: qty 5

## 2018-02-11 MED ORDER — ACETAMINOPHEN 325 MG PO TABS: 650.0000 mg | ORAL_TABLET | ORAL | Status: DC | PRN

## 2018-02-11 MED ORDER — FENTANYL 2.5 MCG/ML BUPIVACAINE 1/10 % EPIDURAL INFUSION (WH - ANES)
14.0000 mL/h | INTRAMUSCULAR | Status: DC | PRN
  Administered 2018-02-11: 14 mL/h via EPIDURAL
  Filled 2018-02-11: qty 100

## 2018-02-11 MED ORDER — FENTANYL CITRATE (PF) 100 MCG/2ML IJ SOLN
INTRAMUSCULAR | Status: AC
  Filled 2018-02-11: qty 2

## 2018-02-11 MED ORDER — PHENYLEPHRINE 40 MCG/ML (10ML) SYRINGE FOR IV PUSH (FOR BLOOD PRESSURE SUPPORT)
80.0000 ug | PREFILLED_SYRINGE | INTRAVENOUS | Status: DC | PRN
  Filled 2018-02-11: qty 10
  Filled 2018-02-11: qty 5

## 2018-02-11 MED ORDER — IBUPROFEN 600 MG PO TABS
600.0000 mg | ORAL_TABLET | Freq: Four times a day (QID) | ORAL | Status: DC
  Administered 2018-02-12 – 2018-02-13 (×6): 600 mg via ORAL
  Filled 2018-02-11 (×6): qty 1

## 2018-02-11 MED ORDER — LACTATED RINGERS IV SOLN
INTRAVENOUS | Status: DC
  Administered 2018-02-11 (×2): via INTRAVENOUS

## 2018-02-11 MED ORDER — FENTANYL CITRATE (PF) 100 MCG/2ML IJ SOLN
100.0000 ug | INTRAMUSCULAR | Status: DC | PRN
  Administered 2018-02-11 (×2): 100 ug via INTRAVENOUS
  Filled 2018-02-11: qty 2

## 2018-02-11 MED ORDER — OXYTOCIN BOLUS FROM INFUSION
500.0000 mL | Freq: Once | INTRAVENOUS | Status: AC
  Administered 2018-02-11: 500 mL via INTRAVENOUS

## 2018-02-11 MED ORDER — OXYCODONE-ACETAMINOPHEN 5-325 MG PO TABS: 2.0000 | ORAL_TABLET | ORAL | Status: DC | PRN

## 2018-02-11 MED ORDER — OXYTOCIN 40 UNITS IN LACTATED RINGERS INFUSION - SIMPLE MED
1.0000 m[IU]/min | INTRAVENOUS | Status: DC
  Administered 2018-02-11: 2 m[IU]/min via INTRAVENOUS
  Filled 2018-02-11: qty 1000

## 2018-02-11 MED ORDER — OXYTOCIN 40 UNITS IN LACTATED RINGERS INFUSION - SIMPLE MED: 2.5000 [IU]/h | INTRAVENOUS | Status: DC

## 2018-02-11 MED ORDER — LACTATED RINGERS IV SOLN: 500.0000 mL | INTRAVENOUS | Status: DC | PRN

## 2018-02-11 MED ORDER — EPHEDRINE 5 MG/ML INJ
10.0000 mg | INTRAVENOUS | Status: DC | PRN
  Filled 2018-02-11: qty 2

## 2018-02-11 MED ORDER — LACTATED RINGERS IV SOLN
500.0000 mL | Freq: Once | INTRAVENOUS | Status: AC
  Administered 2018-02-11: 500 mL via INTRAVENOUS

## 2018-02-11 MED ORDER — TERBUTALINE SULFATE 1 MG/ML IJ SOLN
0.2500 mg | Freq: Once | INTRAMUSCULAR | Status: DC | PRN
  Filled 2018-02-11: qty 1

## 2018-02-11 MED ORDER — LIDOCAINE HCL (PF) 1 % IJ SOLN
INTRAMUSCULAR | Status: DC | PRN
  Administered 2018-02-11 (×2): 5 mL via EPIDURAL

## 2018-02-11 MED ORDER — DIPHENHYDRAMINE HCL 50 MG/ML IJ SOLN
12.5000 mg | INTRAMUSCULAR | Status: DC | PRN
  Administered 2018-02-11: 12.5 mg via INTRAVENOUS
  Filled 2018-02-11: qty 1

## 2018-02-11 MED ORDER — SOD CITRATE-CITRIC ACID 500-334 MG/5ML PO SOLN: 30.0000 mL | ORAL | Status: DC | PRN

## 2018-02-11 NOTE — Anesthesia Pain Management Evaluation Note (Signed)
  [facility] Pain Management Visit Note  Patient: Kimberly MayersKeydra M Adamski, 27 y.o., female  "Hello [facility] am a [facility]. We have an anesthesia team available at all times to provide care throughout the hospital, including epidural management and anesthesia for C-section. I don't know your plan for the delivery whether it a natural birth, water birth, IV sedation, nitrous supplementation, doula or epidural, but we want to meet your pain goals."   1.Was your pain managed to your expectations on prior hospitalizations?   No prior hospitalizations  2.What is your expectation for pain management during this hospitalization?     Epidural and IV pain meds  3.How can we help you reach that goal?    Place epidural if pain becomes unmanageable with IV pain medications (pain score of 9)  Record the patient's initial score and the patient's pain goal.   Pain: 7 - post  IV pain medication.  Pain Goal: 9 [facility] wants you to be able to say your pain was always managed very well.  Vi Biddinger 02/11/2018

## 2018-02-11 NOTE — Progress Notes (Signed)
LABOR PROGRESS NOTE  Kimberly MayersKeydra M Humphrey is a 27 y.o. G1P0 at 2061w6d  admitted for SOL  Subjective: Pt doing well. Feeling contractions, but pain well controlled with epdiural  Objective: BP (!) 146/79   Pulse 69   Temp 97.6 F (36.4 C) (Oral)   Resp 16   Ht 5\' 5"  (1.651 m)   Wt 149 lb (67.6 kg)   LMP 05/15/2017   SpO2 100%   BMI 24.79 kg/m  or  Vitals:   02/11/18 1830 02/11/18 1900 02/11/18 1930 02/11/18 2032  BP: 126/69 126/75 130/70 (!) 146/79  Pulse: (!) 58 75 71 69  Resp: 16 18 16 16   Temp:      TempSrc:      SpO2:      Weight:      Height:        SVE: Dilation: 8.5 Effacement (%): 80 Cervical Position: Middle, Anterior Station: -1 Presentation: Vertex Exam by:: Kimberly BersMichelle Kelly, RN   FHT: baseline rate 140, moderate varibility, +acel, occasional variable decels Toco: ctx q 1.5-3 min   Assessment / Plan: 27 y.o. G1P0 at 7061w6d here for SOL  Labor: Progressing well in first stage. Expectant mangement Fetal Wellbeing:  Cat II Pain Control:  Well-controlled with epidural Anticipated MOD: SVD  Kimberly PearJulie P Meekah Math, MD 02/11/2018, 10:04 PM

## 2018-02-11 NOTE — Progress Notes (Signed)
Patient ID: [name], female   DOB: 04/07/1991, 27 y.o.   MRN: [id number] Comfortable with epidural  Vitals:   02/11/18 1800 02/11/18 1805 02/11/18 1810 02/11/18 1830  BP: 118/79 114/67 121/80 126/69  Pulse: 74 71 (!) 52 (!) 58  Resp:  18  16  Temp:  97.6 F (36.4 C)    TempSrc:  Oral    SpO2: 99% 100%    Weight:      Height:       FHR reassuring UCs irregular  Dilation: 6 Effacement (%): 80 Cervical Position: Middle, Anterior Station: -1 Presentation: Vertex Exam by:: Wynelle BourgeoisMarie Arlone Humphrey, CNM   Will start some Pitocin augmentation

## 2018-02-11 NOTE — Anesthesia Preprocedure Evaluation (Signed)
Anesthesia Evaluation  Patient identified by MRN, date of birth, ID band Patient awake    Reviewed: Allergy & Precautions, H&P , NPO status , Patient's Chart, lab work & pertinent test results  History of Anesthesia Complications Negative for: history of anesthetic complications  Airway Mallampati: II  TM Distance: >3 FB Neck ROM: full    Dental no notable dental hx. (+) Teeth Intact   Pulmonary former smoker,    Pulmonary exam normal breath sounds clear to auscultation       Cardiovascular negative cardio ROS Normal cardiovascular exam Rhythm:regular Rate:Normal     Neuro/Psych negative neurological ROS  negative psych ROS   GI/Hepatic negative GI ROS, Neg liver ROS,   Endo/Other  negative endocrine ROS  Renal/GU negative Renal ROS  negative genitourinary   Musculoskeletal   Abdominal   Peds  Hematology  (+) anemia ,   Anesthesia Other Findings   Reproductive/Obstetrics (+) Pregnancy                             Anesthesia Physical Anesthesia Plan  ASA: II  Anesthesia Plan: Epidural   Post-op Pain Management:    Induction:   PONV Risk Score and Plan:   Airway Management Planned:   Additional Equipment:   Intra-op Plan:   Post-operative Plan:   Informed Consent: I have reviewed the patients History and Physical, chart, labs and discussed the procedure including the risks, benefits and alternatives for the proposed anesthesia with the patient or authorized representative who has indicated his/her understanding and acceptance.     Plan Discussed with:   Anesthesia Plan Comments:         Anesthesia Quick Evaluation

## 2018-02-11 NOTE — Anesthesia Procedure Notes (Signed)
Epidural Patient location during procedure: OB Start time: 02/11/2018 5:25 PM End time: 02/11/2018 5:35 PM  Staffing Anesthesiologist: Leonides GrillsEllender, Murry Diaz P, MD Performed: anesthesiologist   Preanesthetic Checklist Completed: patient identified, site marked, pre-op evaluation, timeout performed, IV checked, risks and benefits discussed and monitors and equipment checked  Epidural Patient position: sitting Prep: DuraPrep Patient monitoring: heart rate, cardiac monitor, continuous pulse ox and blood pressure Approach: midline Location: L4-L5 Injection technique: LOR air  Needle:  Needle type: Tuohy  Needle gauge: 17 G Needle length: 9 cm Needle insertion depth: 5 cm Catheter type: closed end flexible Catheter size: 19 Gauge Catheter at skin depth: 10 cm Test dose: negative and Other  Assessment Events: blood not aspirated, injection not painful, no injection resistance and negative IV test  Additional Notes Informed consent obtained prior to proceeding including risk of failure, 1% risk of PDPH, risk of minor discomfort and bruising. Discussed alternatives to epidural analgesia and patient desires to proceed.  Timeout performed pre-procedure verifying patient name, procedure, and platelet count.  Patient tolerated procedure well. Reason for block:procedure for pain

## 2018-02-11 NOTE — MAU Note (Signed)
Pt having ctx since last night, no LOF, spotting and + FM. Pain 8/10

## 2018-02-11 NOTE — H&P (Addendum)
OBSTETRIC ADMISSION HISTORY AND PHYSICAL  Kimberly Humphrey is a 27 y.o. female G1P0000 with IUP at [redacted]w[redacted]d presenting for SOL with regular contractions that started about 0300 and some bloody show shortly after. She reports +FMs. No LOF, VB, blurry vision, headaches, peripheral edema, or RUQ pain. She plans on brest feeding. She requests usure for birth control. GBS Positive.  Clinic  [facility] Prenatal Labs  Dating  LMP/6wk [place] Blood type: B/Positive/-- (01/29 1610)   Genetic Screen AFP:      NIPS: low risk  Antibody:Negative (01/29 9604)  Anatomic [place] normal Rubella: 4.17 (01/29 0943)  GTT Early:               Third trimester: normal RPR: Non Reactive (01/29 0943)   Flu vaccine 08/14/17 HBsAg: Negative (01/29 0943)   TDaP vaccine       11/28/17                                     HIV: Non Reactive (01/29 0943)  Baby Food Breast                                              GBS: Positive  Contraception Unsure Pap: 08/14/17, normal   Circumcision Info given    Pediatrician List given  CF:  Support Person FOB: Sterling Bassard Sister: Midwife (roommate) SMA: 3 copies, reduced risk   Prenatal Classes Info given  Hgb electrophoresis: nl hgb      Dating: By LMP --->  Estimated Date of Delivery: 02/19/18  Sono:    @24  w6d, CWD, normal anatomy, cephalic presentation, 769g, 36%ile, EFW 1lb 11oz   Prenatal History/Complications: None   Past Medical History: Past Medical History:  Diagnosis Date  . Anemia     Past Surgical History: Past Surgical History:  Procedure Laterality Date  . NO PAST SURGERIES      Obstetrical History: OB History    Gravida  1   Para  0   Term  0   Preterm  0   AB  0   Living  0     SAB  0   TAB  0   Ectopic  0   Multiple  0   Live Births  0           Social History: Social History   Socioeconomic History  . Marital status: Single    Spouse name: Not on file  . Number of children: Not on file  . Years of education: Not on file  .  Highest education level: Not on file  Occupational History  . Not on file  Social Needs  . Financial resource strain: Not on file  . Food insecurity:    Worry: Not on file    Inability: Not on file  . Transportation needs:    Medical: Not on file    Non-medical: Not on file  Tobacco Use  . Smoking status: Former Games developer  . Smokeless tobacco: Never Used  Substance and Sexual Activity  . Alcohol use: No    Frequency: Never  . Drug use: No  . Sexual activity: Not Currently  Lifestyle  . Physical activity:    Days per week: Not on file    Minutes per session: Not on file  .  Stress: Not on file  Relationships  . Social connections:    Talks on phone: Not on file    Gets together: Not on file    Attends religious service: Not on file    Active member of club or organization: Not on file    Attends meetings of clubs or organizations: Not on file    Relationship status: Not on file  Other Topics Concern  . Not on file  Social History Narrative  . Not on file    Family History: History reviewed. No pertinent family history.  Allergies: No Known Allergies  Medications Prior to Admission  Medication Sig Dispense Refill Last Dose  . acetaminophen (TYLENOL) 500 MG tablet Take 1,000 mg by mouth every 8 (eight) hours as needed for mild pain.   01/23/2018 at Unknown time  . ferrous sulfate (FERROUSUL) 325 (65 FE) MG tablet Take 1 tablet (325 mg total) by mouth daily with breakfast. 30 tablet 2   . Prenatal Vit-DSS-Fe Fum-FA (PRENATAL 19) tablet Take 1 tablet by mouth daily. 30 tablet 11 01/22/2018 at Unknown time  . Prenatal Vit-Fe Fumarate-FA (MULTIVITAMIN-PRENATAL) 27-0.8 MG TABS tablet Take 1 tablet by mouth daily at 12 noon.   Taking     Review of Systems   All systems reviewed and negative except as stated in HPI  Blood pressure 136/75, pulse 63, temperature 98.3 F (36.8 C), temperature source Oral, resp. rate 20, height 5\' 5"  (1.651 m), weight 67.6 kg (149 lb), last  menstrual period 05/15/2017. General appearance: alert and cooperative Lungs: regular rate and effort Heart: regular rate  Abdomen: soft, non-tender Extremities: Homans sign is negative, no sign of DVT Presentation: cephalic Fetal monitoringBaseline: 130 bpm, Variability: Good {> 6 bpm), Accelerations: Reactive and Decelerations: Absent Uterine activityFrequency: Every 3 minutes Dilation: 5.5 Effacement (%): 80, 90 Station: -2 Exam by:: CGoodnight, RN    Prenatal labs: ABO, Rh: B/Positive/-- (01/29 4098) Antibody: Negative (01/29 0943) Rubella: 4.17 (01/29 0943) RPR: Non Reactive (05/15 0828)  HBsAg: Negative (01/29 0943)  HIV: Non Reactive (05/15 0828)  GBS:    2 hr GTT normal   Prenatal Transfer Tool  Maternal Diabetes: No Genetic Screening: Normal Maternal Ultrasounds/Referrals: Normal Fetal Ultrasounds or other Referrals:  None Maternal Substance Abuse:  No Significant Maternal Medications:  None Significant Maternal Lab Results: None  Results for orders placed or performed during the hospital encounter of 02/11/18 (from the past 24 hour(s))  CBC   Collection Time: 02/11/18 11:48 AM  Result Value Ref Range   WBC 12.4 (H) 4.0 - 10.5 K/uL   RBC 4.03 3.87 - 5.11 MIL/uL   Hemoglobin 11.0 (L) 12.0 - 15.0 g/dL   HCT 11.9 (L) 14.7 - 82.9 %   MCV 81.6 78.0 - 100.0 fL   MCH 27.3 26.0 - 34.0 pg   MCHC 33.4 30.0 - 36.0 g/dL   RDW 56.2 13.0 - 86.5 %   Platelets 307 150 - 400 K/uL    Patient Active Problem List   Diagnosis Date Noted  . Normal labor 02/11/2018  . GBS (group B Streptococcus carrier), +RV culture, currently pregnant 02/07/2018  . Anemia affecting pregnancy in third trimester 11/29/2017  . Supervision of normal first pregnancy, antepartum 08/14/2017    Assessment: Kimberly Humphrey is a 27 y.o. G1P0000 at [redacted]w[redacted]d here for SOL of term IUP   1. Labor: Late Latent  2. FWB: Cat 1 3. Pain: Mild  4. GBS: Positive     Plan: Admitted to L&D  Expectant  management of labour with anticipated NSVD Maternal Fetal monitoring  Ampicillin 2 g Stat dose to continue Q4hr for 2 days  Counseled on pain control and epidural is still undecided  [facility] and undecided  Continue to monitor progression of labour   Sandi RavelingAnson B Alvis, MD  02/11/2018, 12:56 PM   I confirm that I have verified the information documented in the resident's note and that I have also personally reperformed the physical exam and all medical decision making activities. The patient was seen and examined by me also Agree with note NST reactive and reassuring UCs as listed Cervical exams as listed in note Anticipate SVD  Aviva SignsWilliams, Nasire Reali L, CNM

## 2018-02-12 ENCOUNTER — Encounter (HOSPITAL_COMMUNITY): Payer: Self-pay

## 2018-02-12 ENCOUNTER — Other Ambulatory Visit: Payer: Self-pay

## 2018-02-12 LAB — RPR: RPR Ser Ql: NONREACTIVE

## 2018-02-12 MED ORDER — WITCH HAZEL-GLYCERIN EX PADS: 1.0000 "application " | MEDICATED_PAD | CUTANEOUS | Status: DC | PRN

## 2018-02-12 MED ORDER — SENNOSIDES-DOCUSATE SODIUM 8.6-50 MG PO TABS
2.0000 | ORAL_TABLET | ORAL | Status: DC
  Administered 2018-02-12: 2 via ORAL
  Filled 2018-02-12: qty 2

## 2018-02-12 MED ORDER — TETANUS-DIPHTH-ACELL PERTUSSIS 5-2.5-18.5 LF-MCG/0.5 IM SUSP: 0.5000 mL | Freq: Once | INTRAMUSCULAR | Status: DC

## 2018-02-12 MED ORDER — DIBUCAINE 1 % RE OINT: 1.0000 "application " | TOPICAL_OINTMENT | RECTAL | Status: DC | PRN

## 2018-02-12 MED ORDER — DIPHENHYDRAMINE HCL 25 MG PO CAPS: 25.0000 mg | ORAL_CAPSULE | Freq: Four times a day (QID) | ORAL | Status: DC | PRN

## 2018-02-12 MED ORDER — SIMETHICONE 80 MG PO CHEW: 80.0000 mg | CHEWABLE_TABLET | ORAL | Status: DC | PRN

## 2018-02-12 MED ORDER — PRENATAL MULTIVITAMIN CH
1.0000 | ORAL_TABLET | Freq: Every day | ORAL | Status: DC
  Administered 2018-02-12 – 2018-02-13 (×2): 1 via ORAL
  Filled 2018-02-12 (×2): qty 1

## 2018-02-12 MED ORDER — BENZOCAINE-MENTHOL 20-0.5 % EX AERO
1.0000 "application " | INHALATION_SPRAY | CUTANEOUS | Status: DC | PRN
  Administered 2018-02-12: 1 via TOPICAL
  Filled 2018-02-12: qty 56

## 2018-02-12 MED ORDER — ACETAMINOPHEN 325 MG PO TABS
650.0000 mg | ORAL_TABLET | ORAL | Status: DC | PRN
  Administered 2018-02-12: 650 mg via ORAL
  Filled 2018-02-12: qty 2

## 2018-02-12 MED ORDER — COCONUT OIL OIL: 1.0000 "application " | TOPICAL_OIL | Status: DC | PRN

## 2018-02-12 MED ORDER — ONDANSETRON HCL 4 MG/2ML IJ SOLN: 4.0000 mg | INTRAMUSCULAR | Status: DC | PRN

## 2018-02-12 MED ORDER — ONDANSETRON HCL 4 MG PO TABS: 4.0000 mg | ORAL_TABLET | ORAL | Status: DC | PRN

## 2018-02-12 MED ORDER — IBUPROFEN 600 MG PO TABS: 600.0000 mg | ORAL_TABLET | Freq: Four times a day (QID) | ORAL | 0 refills | Status: DC | PRN

## 2018-02-12 MED ORDER — ZOLPIDEM TARTRATE 5 MG PO TABS: 5.0000 mg | ORAL_TABLET | Freq: Every evening | ORAL | Status: DC | PRN

## 2018-02-12 NOTE — Progress Notes (Signed)
D/c'ed SL, pt tolerated well

## 2018-02-12 NOTE — Progress Notes (Signed)
Post Partum Day1 Subjective: no complaints, up ad lib, voiding, tolerating PO and + flatus  Objective: Blood pressure 122/72, pulse 77, temperature 98.5 F (36.9 C), temperature source Oral, resp. rate 18, height 5\' 5"  (1.651 m), weight 149 lb (67.6 kg), last menstrual period 05/15/2017, SpO2 100 %, unknown if currently breastfeeding.  Physical Exam:  General: alert and no distress Lochia: appropriate Uterine Fundus: firm, NT DVT Evaluation: No evidence of DVT seen on physical exam. Negative Homan's sign. No cords or calf tenderness.  Recent Labs    02/11/18 1148  HGB 11.0*  HCT 32.9*    Assessment/Plan: Plan for discharge tomorrow, Breastfeeding and Contraception undecided   LOS: 1 day   Jaynie CollinsUgonna Kadeshia Kasparian, MD 02/12/2018, 2:18 PM

## 2018-02-12 NOTE — Discharge Instructions (Signed)
Vaginal Delivery, Care After °Refer to this sheet in the next few weeks. These instructions provide you with information about caring for yourself after vaginal delivery. Your health care provider may also give you more specific instructions. Your treatment has been planned according to current medical practices, but problems sometimes occur. Call your health care provider if you have any problems or questions. °What can I expect after the procedure? °After vaginal delivery, it is common to have: °· Some bleeding from your vagina. °· Soreness in your abdomen, your vagina, and the area of skin between your vaginal opening and your anus (perineum). °· Pelvic cramps. °· Fatigue. ° °Follow these instructions at home: °Medicines °· Take over-the-counter and prescription medicines only as told by your health care provider. °· If you were prescribed an antibiotic medicine, take it as told by your health care provider. Do not stop taking the antibiotic until it is finished. °Driving ° °· Do not drive or operate heavy machinery while taking prescription pain medicine. °· Do not drive for 24 hours if you received a sedative. °Lifestyle °· Do not drink alcohol. This is especially important if you are breastfeeding or taking medicine to relieve pain. °· Do not use tobacco products, including cigarettes, chewing tobacco, or e-cigarettes. If you need help quitting, ask your health care provider. °Eating and drinking °· Drink at least 8 eight-ounce glasses of water every day unless you are told not to by your health care provider. If you choose to breastfeed your baby, you may need to drink more water than this. °· Eat high-fiber foods every day. These foods may help prevent or relieve constipation. High-fiber foods include: °? Whole grain cereals and breads. °? Brown rice. °? Beans. °? Fresh fruits and vegetables. °Activity °· Return to your normal activities as told by your health care provider. Ask your health care provider  what activities are safe for you. °· Rest as much as possible. Try to rest or take a nap when your baby is sleeping. °· Do not lift anything that is heavier than your baby or 10 lb (4.5 kg) until your health care provider says that it is safe. °· Talk with your health care provider about when you can engage in sexual activity. This may depend on your: °? Risk of infection. °? Rate of healing. °? Comfort and desire to engage in sexual activity. °Vaginal Care °· If you have an episiotomy or a vaginal tear, check the area every day for signs of infection. Check for: °? More redness, swelling, or pain. °? More fluid or blood. °? Warmth. °? Pus or a bad smell. °· Do not use tampons or douches until your health care provider says this is safe. °· Watch for any blood clots that may pass from your vagina. These may look like clumps of dark red, brown, or black discharge. °General instructions °· Keep your perineum clean and dry as told by your health care provider. °· Wear loose, comfortable clothing. °· Wipe from front to back when you use the toilet. °· Ask your health care provider if you can shower or take a bath. If you had an episiotomy or a perineal tear during labor and delivery, your health care provider may tell you not to take baths for a certain length of time. °· Wear a bra that supports your breasts and fits you well. °· If possible, have someone help you with household activities and help care for your baby for at least a few days after   you leave the hospital. °· Keep all follow-up visits for you and your baby as told by your health care provider. This is important. °Contact a health care provider if: °· You have: °? Vaginal discharge that has a bad smell. °? Difficulty urinating. °? Pain when urinating. °? A sudden increase or decrease in the frequency of your bowel movements. °? More redness, swelling, or pain around your episiotomy or vaginal tear. °? More fluid or blood coming from your episiotomy or  vaginal tear. °? Pus or a bad smell coming from your episiotomy or vaginal tear. °? A fever. °? A rash. °? Little or no interest in activities you used to enjoy. °? Questions about caring for yourself or your baby. °· Your episiotomy or vaginal tear feels warm to the touch. °· Your episiotomy or vaginal tear is separating or does not appear to be healing. °· Your breasts are painful, hard, or turn red. °· You feel unusually sad or worried. °· You feel nauseous or you vomit. °· You pass large blood clots from your vagina. If you pass a blood clot from your vagina, save it to show to your health care provider. Do not flush blood clots down the toilet without having your health care provider look at them. °· You urinate more than usual. °· You are dizzy or light-headed. °· You have not breastfed at all and you have not had a menstrual period for 12 weeks after delivery. °· You have stopped breastfeeding and you have not had a menstrual period for 12 weeks after you stopped breastfeeding. °Get help right away if: °· You have: °? Pain that does not go away or does not get better with medicine. °? Chest pain. °? Difficulty breathing. °? Blurred vision or spots in your vision. °? Thoughts about hurting yourself or your baby. °· You develop pain in your abdomen or in one of your legs. °· You develop a severe headache. °· You faint. °· You bleed from your vagina so much that you fill two sanitary pads in one hour. °This information is not intended to replace advice given to you by your health care provider. Make sure you discuss any questions you have with your health care provider. °Document Released: 06/30/2000 Document Revised: 12/15/2015 Document Reviewed: 07/18/2015 °Elsevier Interactive Patient Education © 2018 [employer] ° °

## 2018-02-12 NOTE — Anesthesia Postprocedure Evaluation (Signed)
Anesthesia Post Note  Patient: Kimberly Humphrey  Procedure(s) Performed: AN AD HOC LABOR EPIDURAL     Patient location during evaluation: Mother Baby Anesthesia Type: Epidural Level of consciousness: awake and alert Pain management: pain level controlled Vital Signs Assessment: post-procedure vital signs reviewed and stable Respiratory status: spontaneous breathing, nonlabored ventilation and respiratory function stable Cardiovascular status: stable Postop Assessment: no headache, no backache, epidural receding, able to ambulate, adequate PO intake, no apparent nausea or vomiting and patient able to bend at knees Anesthetic complications: no    Last Vitals:  Vitals:   02/12/18 0312 02/12/18 0555  BP: 119/70 118/64  Pulse: 64 67  Resp: 16 16  Temp:  36.8 C  SpO2: 100% 100%    Last Pain:  Vitals:   02/12/18 0715  TempSrc:   PainSc: 0-No pain   Pain Goal: Patients Stated Pain Goal: 5 (02/11/18 1230)               Thermon Zulauf Hristova

## 2018-02-12 NOTE — Progress Notes (Signed)
[facility].

## 2018-02-12 NOTE — Lactation Note (Signed)
This note was copied from a baby's chart. Lactation Consultation Note  Patient Name: Girl Chalmers CaterKeydra Hinchcliff ZOXWR'UToday's Date: 02/12/2018 Reason for consult: Initial assessment;Early term 6337-38.6wks G1P1 vag delivery with epidural and pitocin.  Mom with visitors in room.Visitors holding infant.   Mom reports she feels breastfeeding is going great.  Mom with no breastfeeding education. Reviewed understanding breastfeeding book with mom and Understanding Mother & Baby care the first days:feeding.  Infant will be 24 hours old at 11:10 pm.  Reviewed cluster feeding.  Urged to offer the breast based on baby's cues and every 3 hours.  Gave resource list for home use and information regarding BFSG.  Urged mom to come.    Maternal Data Has patient been taught Hand Expression?: Yes Does the patient have breastfeeding experience prior to this delivery?: No  Feeding Feeding Type: Breast Fed  LATCH Score Latch: Grasps breast easily, tongue down, lips flanged, rhythmical sucking.  Audible Swallowing: A few with stimulation  Type of Nipple: Everted at rest and after stimulation  Comfort (Breast/Nipple): Soft / non-tender  Hold (Positioning): Assistance needed to correctly position infant at breast and maintain latch.  LATCH Score: 8  Interventions    Lactation Tools Discussed/Used     Consult Status Consult Status: Follow-up Date: 02/13/18 Follow-up type: In-patient    [facility] Michaelle CopasS Shristi Scheib 02/12/2018, 6:09 PM

## 2018-02-13 LAB — BIRTH TISSUE RECOVERY COLLECTION (PLACENTA DONATION)

## 2018-02-13 NOTE — Discharge Summary (Addendum)
OB Discharge Summary     Patient Name: Kimberly Humphrey DOB: 09-25-1990 MRN: [id number]  Date of admission: 02/11/2018 Delivering MD: Frederik Pear   Date of discharge: 02/13/2018  Admitting diagnosis: 38WKS,CTX Intrauterine pregnancy: [redacted]w[redacted]d     Secondary diagnosis:  Active Problems:   Normal labor  Additional problems: anemia     Discharge diagnosis: Term Pregnancy Delivered and Anemia                                                                                                Post partum procedures:none  Augmentation: AROM and Pitocin  Complications: None  Hospital course:  Onset of Labor With Vaginal Delivery     27 y.o. yo G1P1001 at [redacted]w[redacted]d was admitted in Active Labor on 02/11/2018. Patient had an uncomplicated labor course as follows:  Membrane Rupture Time/Date: 10:24 PM ,02/11/2018   Intrapartum Procedures: Episiotomy: None [1]                                         Lacerations:  Labial [10]  Patient had a delivery of a Viable infant. 02/11/2018  Information for the patient's newborn:  Caileen, Veracruz [[id number]]  Delivery Method: Vaginal, Spontaneous(Filed from Delivery Summary)    Pateint had an uncomplicated postpartum course.  She is ambulating, tolerating a regular diet, passing flatus, and urinating well. Patient is discharged home in stable condition on 02/13/18.   Physical exam  Vitals:   02/12/18 1000 02/12/18 1400 02/12/18 2236 02/13/18 0511  BP: 128/72 122/72 112/63 108/64  Pulse: (!) 55 77 (!) 58 (!) 56  Resp: 18 18 18 16   Temp: 98.5 F (36.9 C) 98.5 F (36.9 C) 98.2 F (36.8 C) 98.4 F (36.9 C)  TempSrc: Oral Oral Oral Oral  SpO2:  100%  100%  Weight:      Height:       General: alert, cooperative and no distress Lochia: appropriate Uterine Fundus: firm Incision: N/A DVT Evaluation: No evidence of DVT seen on physical exam. Labs: Lab Results  Component Value Date   WBC 12.4 (H) 02/11/2018   HGB 11.0 (L) 02/11/2018   HCT  32.9 (L) 02/11/2018   MCV 81.6 02/11/2018   PLT 307 02/11/2018   No flowsheet data found.  Discharge instruction: per After Visit Summary and "Baby and Me Booklet".  After visit meds:  Allergies as of 02/13/2018   No Known Allergies     Medication List    TAKE these medications   acetaminophen 500 MG tablet Commonly known as:  TYLENOL Take 1,000 mg by mouth every 8 (eight) hours as needed for mild pain.   diphenhydrAMINE 25 MG tablet Commonly known as:  BENADRYL Take 25 mg by mouth every 6 (six) hours as needed for sleep.   ferrous sulfate 325 (65 FE) MG tablet Commonly known as:  FERROUSUL Take 1 tablet (325 mg total) by mouth daily with breakfast.   ibuprofen 600 MG tablet Commonly known as:  ADVIL,MOTRIN Take 1 tablet (  600 mg total) by mouth every 6 (six) hours as needed for moderate pain or cramping.   PRENATAL 19 tablet Take 1 tablet by mouth daily.            Discharge Care Instructions  (From admission, onward)        Start     Ordered   02/12/18 0000  Discharge wound care:    Comments:  As per discharge handout and nursing instructions   02/12/18 1151      Diet: routine diet  Activity: Advance as tolerated. Pelvic rest for 6 weeks.   Outpatient follow up:4 weeks Follow up Appt:No future appointments. Follow up Visit:No follow-ups on file.  Postpartum contraception: Condoms  Newborn Data: Live born female  Birth Weight: 6 lb 2.9 oz (2804 g) APGAR: 7, 9  Newborn Delivery   Birth date/time:  02/11/2018 23:10:00 Delivery type:  Vaginal, Spontaneous     Baby Feeding: Breast Disposition:home with mother   02/13/2018 [name], MD  Midwife attestation I have seen and examined this patient and agree with above documentation in the resident's note.   Kimberly Humphrey is a 27 y.o. G1P1001 s/p SVD.  Pain is well controlled. Plan for birth control is condoms. Method of Feeding: breast  PE:  Gen: well appearing Heart: reg  rate Lungs: normal WOB Fundus firm Ext: no pain, no edema  Recent Labs    02/11/18 1148  HGB 11.0*  HCT 32.9*     Assessment S/p SVD PPD # 2  Plan: - discharge home - postpartum care discussed - f/u clinic in 6 weeks for postpartum visit   [name], CNM 1:53 PM

## 2018-02-13 NOTE — Lactation Note (Signed)
This note was copied from a baby's chart. Lactation Consultation Note: Kimberly Humphrey has just been fed 20 ml of EBM by curved tip syringe and is asleep in mom's arms.   Mom reports her breasts are feeling fuller this morning. Reviewed engorgement prevention and treatment. [facility]. I showed her how to use her DEBP pieces as manual pump.   Mom reports baby has been latching well with a little pain with initial latch that then eases off.   Reviewed our phone number, OP appointments and BFSG as resources for support after DC. No questions at present. To call prn  Patient Name: Kimberly Humphrey [id number]'s Date: 02/13/2018 Reason for consult: Follow-up assessment;Primapara   Maternal Data Formula Feeding for Exclusion: No Has patient been taught Hand Expression?: Yes Does the patient have breastfeeding experience prior to this delivery?: No  Feeding Feeding Type: Breast Milk  LATCH Score                   Interventions Interventions: Breast feeding basics reviewed;Expressed milk;Hand express;Coconut oil  Lactation Tools Discussed/Used     Consult Status Consult Status: Complete    Kimberly Humphrey, Kimberly Humphrey D 02/13/2018, 8:10 AM

## 2018-02-15 ENCOUNTER — Encounter: Payer: Self-pay | Admitting: Obstetrics & Gynecology

## 2018-02-18 ENCOUNTER — Encounter: Payer: Medicaid Other | Admitting: Advanced Practice Midwife

## 2018-02-25 ENCOUNTER — Encounter: Payer: Medicaid Other | Admitting: Advanced Practice Midwife

## 2018-03-25 ENCOUNTER — Ambulatory Visit (INDEPENDENT_AMBULATORY_CARE_PROVIDER_SITE_OTHER): Payer: Medicaid Other | Admitting: Advanced Practice Midwife

## 2018-03-25 DIAGNOSIS — Z1389 Encounter for screening for other disorder: Secondary | ICD-10-CM

## 2018-03-25 MED ORDER — ETONOGESTREL-ETHINYL ESTRADIOL 0.12-0.015 MG/24HR VA RING: VAGINAL_RING | VAGINAL | 12 refills | Status: DC

## 2018-03-25 NOTE — Progress Notes (Signed)
Subjective:     Kimberly Humphrey is a 27 y.o. female who presents for a postpartum visit. She is 6 weeks postpartum following a spontaneous vaginal delivery. I have fully reviewed the prenatal and intrapartum course. The delivery was at 38.6 gestational weeks. Outcome: spontaneous vaginal delivery. Anesthesia: epidural. Postpartum course has been unremarkable. Baby's course has been unremarkable. Baby is feeding by breast. Bleeding staining only. Bowel function is normal. Bladder function is normal. Patient is not sexually active. Contraception method is none. Postpartum depression screening: negative.  The following portions of the patient's history were reviewed and updated as appropriate: allergies, current medications, past family history, past medical history, past social history, past surgical history and problem list.  Review of Systems Pertinent items are noted in HPI.   Objective:    There were no vitals taken for this visit.  General:  alert, cooperative and no distress   Breasts:  negative  Lungs: clear to auscultation bilaterally  Heart:  regular rate and rhythm, S1, S2 normal, no murmur, click, rub or gallop  Abdomen: soft, non-tender; bowel sounds normal; no masses,  no organomegaly   Vulva:  not evaluated  Vagina: not evaluated  Cervix:  not examined   Corpus: not examined  Adnexa:  not evaluated  Rectal Exam: Not performed.        Assessment:     Routine postpartum exam. Pap smear not done at today's visit.   Plan:    1. Contraception: NuvaRing vaginal inserts 2. Routine care 3. Follow up in: 1 year or as needed.   4. Patient can call if Nuva Ring impacts milk supply and she wants to switch to POPs or another progesterone only method.

## 2018-03-25 NOTE — Patient Instructions (Signed)

## 2018-05-26 ENCOUNTER — Other Ambulatory Visit: Payer: Self-pay

## 2018-05-26 ENCOUNTER — Encounter (HOSPITAL_COMMUNITY): Payer: Self-pay | Admitting: Obstetrics and Gynecology

## 2018-05-26 DIAGNOSIS — M7751 Other enthesopathy of right foot: Secondary | ICD-10-CM | POA: Diagnosis not present

## 2018-05-26 DIAGNOSIS — Z79899 Other long term (current) drug therapy: Secondary | ICD-10-CM | POA: Insufficient documentation

## 2018-05-26 DIAGNOSIS — Z87891 Personal history of nicotine dependence: Secondary | ICD-10-CM | POA: Diagnosis not present

## 2018-05-26 DIAGNOSIS — M79671 Pain in right foot: Secondary | ICD-10-CM | POA: Diagnosis present

## 2018-05-26 NOTE — ED Triage Notes (Signed)
Pt reports she woke up yesterday morning and could not put any weight on her right foot. Pt reports the pain is in the bottom portion near the arch. Pt denies any injury to the area. No swelling noted, but she reports the pain has worsened today and is concerned. Pt reports she was working the day before but nothing out of the ordinary.

## 2018-05-27 ENCOUNTER — Emergency Department (HOSPITAL_COMMUNITY): Payer: Medicaid Other

## 2018-05-27 ENCOUNTER — Emergency Department (HOSPITAL_COMMUNITY)
Admission: EM | Admit: 2018-05-27 | Discharge: 2018-05-27 | Disposition: A | Discharge status: Home / Self Care | Payer: Medicaid Other | Attending: Emergency Medicine | Admitting: Emergency Medicine

## 2018-05-27 DIAGNOSIS — M7751 Other enthesopathy of right foot: Secondary | ICD-10-CM

## 2018-05-27 MED ORDER — HYDROCODONE-ACETAMINOPHEN 5-325 MG PO TABS: 1.0000 | ORAL_TABLET | Freq: Four times a day (QID) | ORAL | 0 refills | Status: DC | PRN

## 2018-05-27 MED ORDER — PREDNISONE 50 MG PO TABS: 50.0000 mg | ORAL_TABLET | Freq: Every day | ORAL | 0 refills | Status: AC

## 2018-05-27 MED ORDER — ACETAMINOPHEN 500 MG PO TABS
1000.0000 mg | ORAL_TABLET | Freq: Once | ORAL | Status: AC
  Administered 2018-05-27: 1000 mg via ORAL
  Filled 2018-05-27: qty 2

## 2018-05-27 NOTE — Discharge Instructions (Addendum)
Return here as needed. If you are breast feeding you may want to avoid the pain medication and use tylenol instead.

## 2018-05-27 NOTE — ED Notes (Signed)
Pt states she has crutches at home and will use those.

## 2018-05-27 NOTE — ED Provider Notes (Signed)
[facility]-EMERGENCY DEPT Provider Note   CSN: [id number] Arrival date & time: 05/26/18  2330     History   Chief Complaint Chief Complaint  Patient presents with  . Foot Pain    HPI Kimberly Humphrey is a 27 y.o. female.  HPI Patient presents to the emergency department with right foot pain that started when she awoke this morning.  The patient states when she stood up she had severe pain in the right foot.  She states that unable to bear weight due to the pain.  Patient states that she does not recall any injury to the foot.  She states she does stand a lot at work.  Patient states that there is no wounds to the foot.  Patient denies any fevers, nausea, vomiting, weakness, dizziness, headache, blurred vision, or syncope. Past Medical History:  Diagnosis Date  . Anemia     Patient Active Problem List   Diagnosis Date Noted  . Normal labor 02/11/2018  . Anemia affecting pregnancy in third trimester 11/29/2017  . Supervision of normal first pregnancy, antepartum 08/14/2017    Past Surgical History:  Procedure Laterality Date  . NO PAST SURGERIES       OB History    Gravida  1   Para  1   Term  1   Preterm  0   AB  0   Living  1     SAB  0   TAB  0   Ectopic  0   Multiple  0   Live Births  1            Home Medications    Prior to Admission medications   Medication Sig Start Date End Date Taking? Authorizing Provider  acetaminophen (TYLENOL) 500 MG tablet Take 1,000 mg by mouth every 8 (eight) hours as needed for mild pain.   Yes [provider]  diphenhydrAMINE (BENADRYL) 25 MG tablet Take 25 mg by mouth every 6 (six) hours as needed for sleep.   Yes [provider]  etonogestrel-ethinyl estradiol (NUVARING) 0.12-0.015 MG/24HR vaginal ring Insert vaginally and leave in place for 3 consecutive weeks, then remove for 1 week. Patient not taking: Reported on 05/27/2018 03/25/18   Thressa Sheller D, CNM    ferrous sulfate (FERROUSUL) 325 (65 FE) MG tablet Take 1 tablet (325 mg total) by mouth daily with breakfast. Patient not taking: Reported on 03/25/2018 02/04/18   Allie Bossier, MD  ibuprofen (ADVIL,MOTRIN) 600 MG tablet Take 1 tablet (600 mg total) by mouth every 6 (six) hours as needed for moderate pain or cramping. Patient not taking: Reported on 05/27/2018 02/12/18   Anyanwu, Jethro Bastos, MD  Prenatal Vit-DSS-Fe Fum-FA (PRENATAL 19) tablet Take 1 tablet by mouth daily. Patient not taking: Reported on 05/27/2018 10/11/17   Armando Reichert, CNM    Family History No family history on file.  Social History Social History   Tobacco Use  . Smoking status: Former Games developer  . Smokeless tobacco: Never Used  Substance Use Topics  . Alcohol use: Yes    Frequency: Never    Comment: Not in 3 months  . Drug use: Yes    Frequency: 1.0 times per week    Types: Marijuana    Comment: Last use 11/9     Allergies   Patient has no known allergies.   Review of Systems Review of Systems All other systems negative except as documented in the HPI. All pertinent positives and  negatives as reviewed in the HPI.  Physical Exam Updated Vital Signs BP (!) 142/83 (BP Location: Right Arm)   Pulse 72   Temp 98.5 F (36.9 C) (Oral)   Resp 16   Ht 5\' 5"  (1.651 m)   Wt 59.9 kg   LMP 05/24/2018 (Exact Date)   SpO2 100%   Breastfeeding? Yes   BMI 21.97 kg/m   Physical Exam  Constitutional: She is oriented to person, place, and time. She appears well-developed and well-nourished. No distress.  HENT:  Head: Normocephalic and atraumatic.  Eyes: Pupils are equal, round, and reactive to light.  Pulmonary/Chest: Effort normal.  Musculoskeletal:       Feet:  Neurological: She is alert and oriented to person, place, and time.  Skin: Skin is warm and dry.  Psychiatric: She has a normal mood and affect.  Nursing note and vitals reviewed.    ED Treatments / Results  Labs (all labs ordered are listed,  but only abnormal results are displayed) Labs Reviewed - No data to display  EKG None  Radiology Dg Foot Complete Right  Result Date: 05/27/2018 CLINICAL DATA:  Acute onset of right foot pain. Unable to put weight on right foot. Initial encounter. EXAM: RIGHT FOOT COMPLETE - 3+ VIEW COMPARISON:  None. FINDINGS: There is no evidence of fracture or dislocation. The joint spaces are preserved. There is no evidence of talar subluxation; the subtalar joint is unremarkable in appearance. Os peroneum fragments are noted. No significant soft tissue abnormalities are seen. IMPRESSION: 1. No evidence of fracture or dislocation. 2. Os peroneum fragments noted. Electronically Signed   By: Roanna Raider M.D.   On: 05/27/2018 00:27    Procedures Procedures (including critical care time)  Medications Ordered in ED Medications  acetaminophen (TYLENOL) tablet 1,000 mg (1,000 mg Oral Given 05/27/18 0152)     Initial Impression / Assessment and Plan / ED Course  I have reviewed the triage vital signs and the nursing notes.  Pertinent labs & imaging results that were available during my care of the patient were reviewed by me and considered in my medical decision making (see chart for details).     Patient has Os Peroneum Fragments noted on x-ray she will cause pain in this region.  I feel that this may be the cause we will have her follow-up orthopedics.  Patient is advised to ice and elevate the foot.  Told return here as needed.  Final Clinical Impressions(s) / ED Diagnoses   Final diagnoses:  None    ED Discharge Orders    None       Charlestine Night, PA-C 05/27/18 0200    Alvira Monday, MD 05/27/18 1946

## 2018-06-06 ENCOUNTER — Ambulatory Visit (INDEPENDENT_AMBULATORY_CARE_PROVIDER_SITE_OTHER): Payer: Self-pay | Admitting: Physician Assistant

## 2018-07-26 ENCOUNTER — Other Ambulatory Visit (HOSPITAL_COMMUNITY): Payer: Self-pay | Admitting: Obstetrics & Gynecology

## 2018-07-26 ENCOUNTER — Other Ambulatory Visit: Payer: Self-pay | Admitting: Advanced Practice Midwife

## 2018-07-26 DIAGNOSIS — B9689 Other specified bacterial agents as the cause of diseases classified elsewhere: Secondary | ICD-10-CM

## 2018-07-26 DIAGNOSIS — N760 Acute vaginitis: Principal | ICD-10-CM

## 2018-09-06 ENCOUNTER — Other Ambulatory Visit: Payer: Self-pay | Admitting: Advanced Practice Midwife

## 2018-12-26 IMAGING — US US OB TRANSVAGINAL
1 series · 15 of 28 positions shown · non-contrast
Comparison: None.

CLINICAL DATA: Cramping, spotting

EXAM:
OBSTETRIC <14 WK US AND TRANSVAGINAL OB US
TECHNIQUE: Both transabdominal and transvaginal ultrasound examinations were
performed for complete evaluation of the gestation as well as the
maternal uterus, adnexal regions, and pelvic cul-de-sac.
Transvaginal technique was performed to assess early pregnancy.

[Series 1: us ob transvaginal · 15 of 62 slices shown]
[im 1/62]
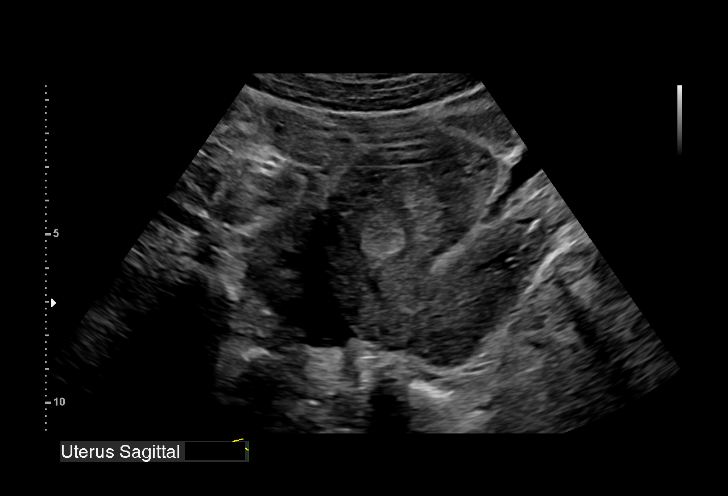
[im 5/62]
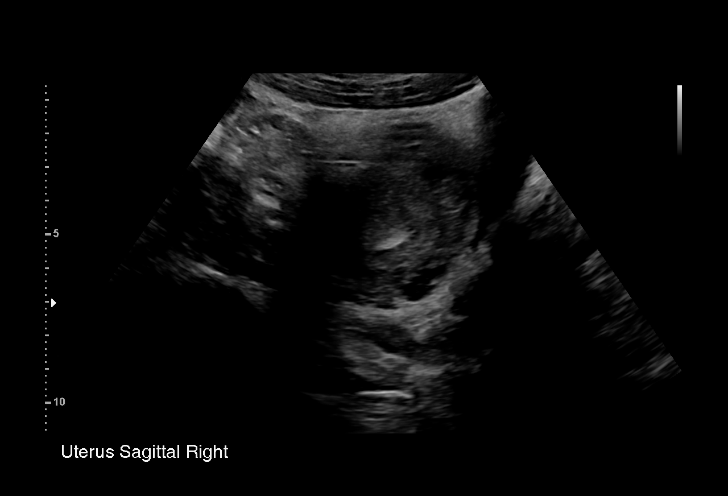
[im 10/62]
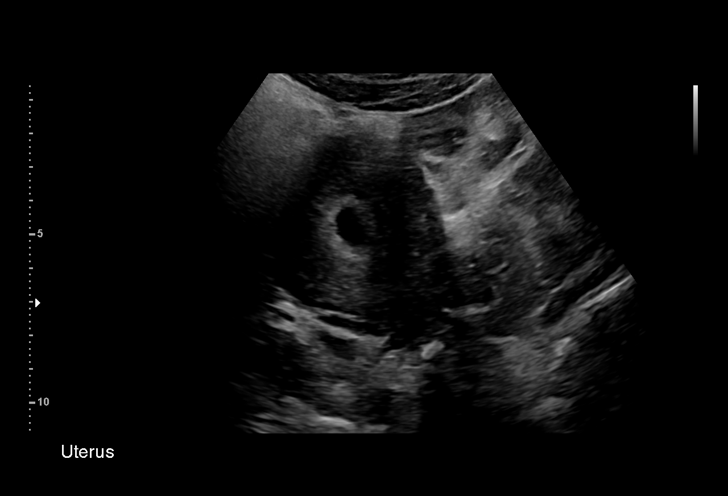
[im 14/62]
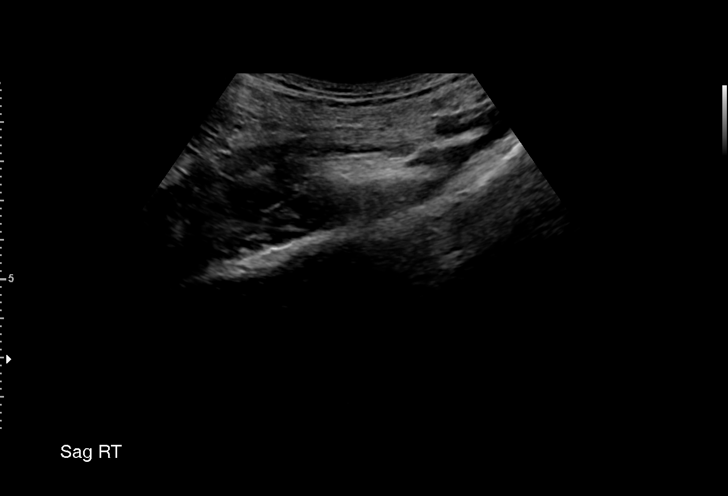
[im 19/62]
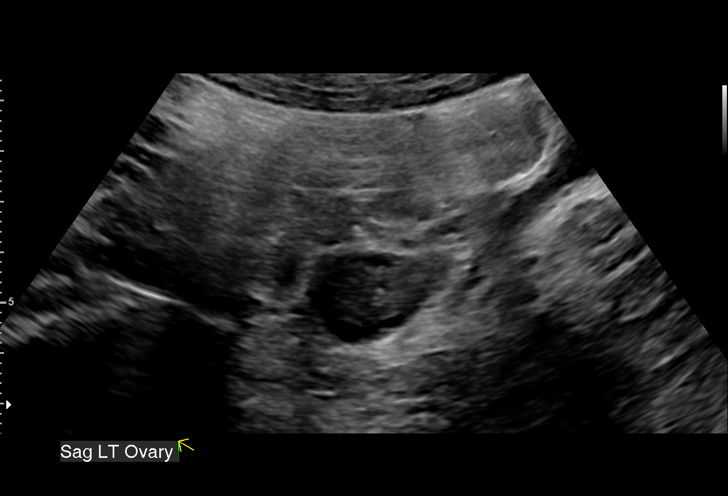
[im 23/62]
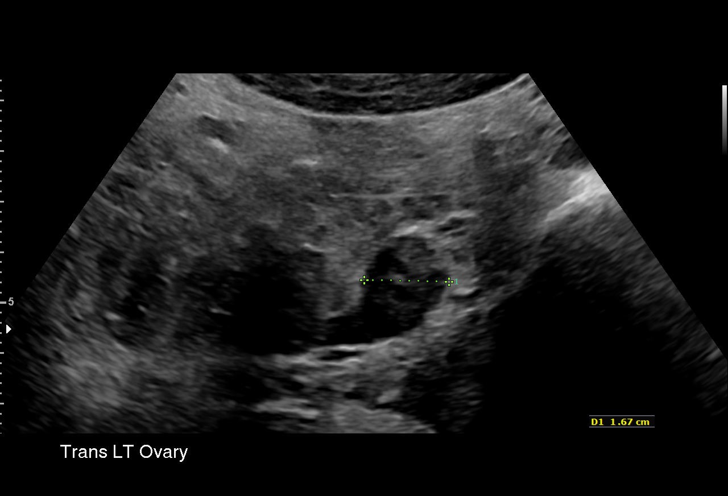
[im 28/62]
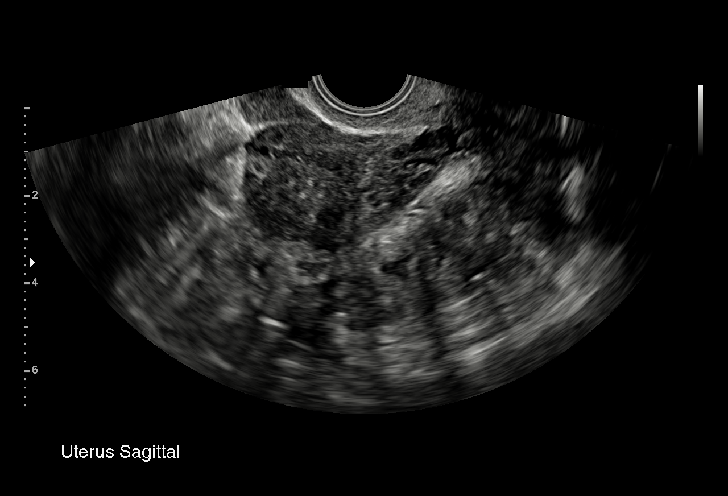
[im 32/62]
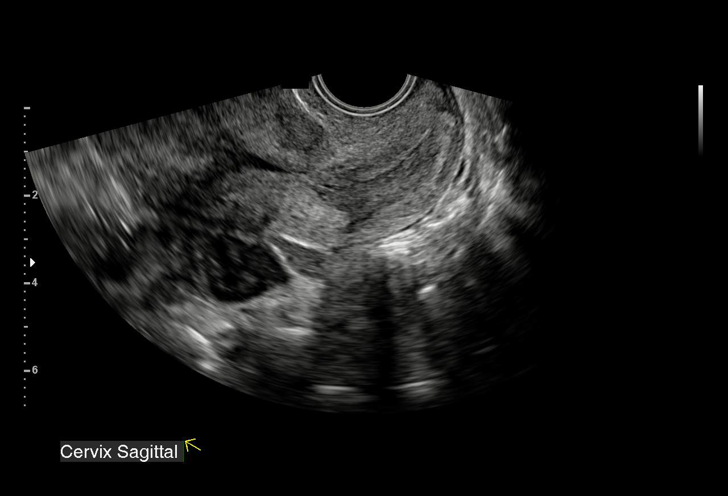
[im 34/62]
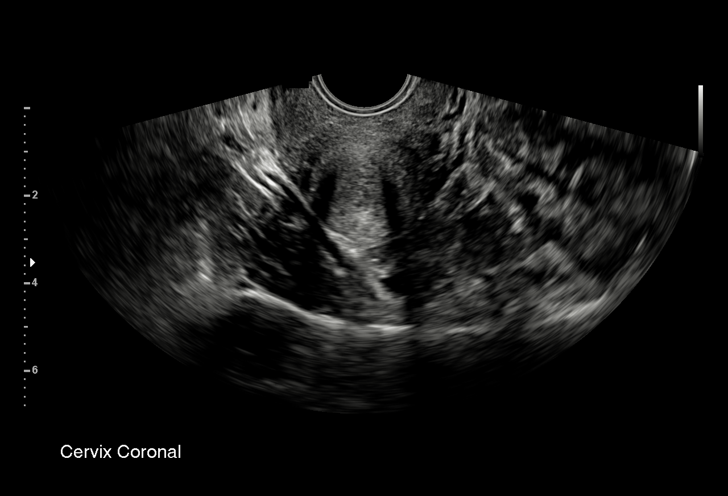
[im 39/62]
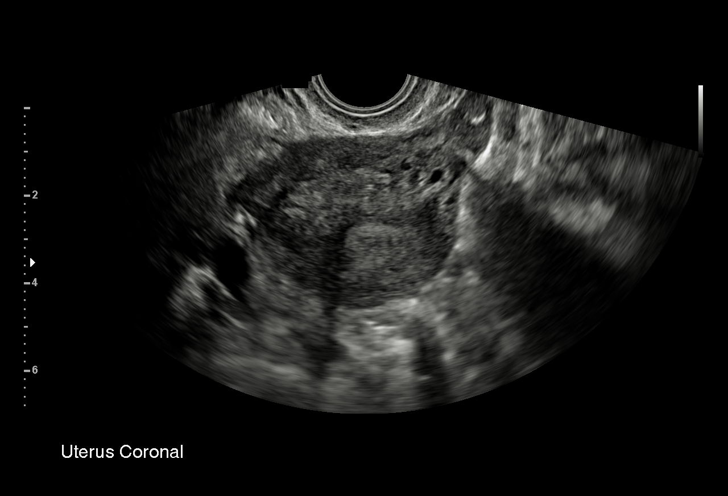
[im 43/62]
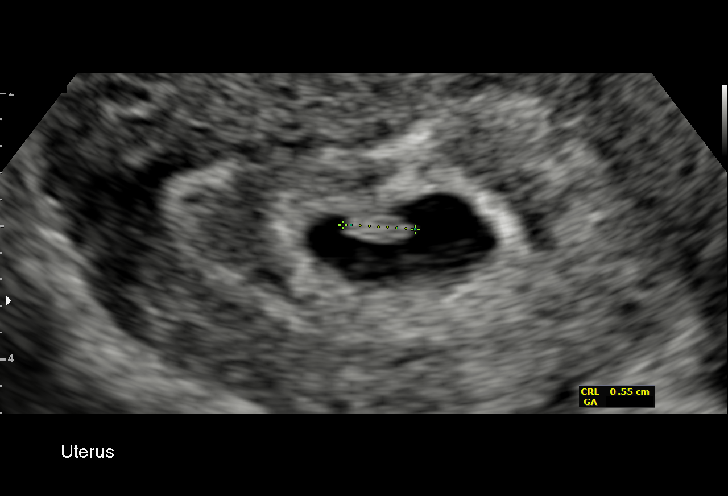
[im 48/62]
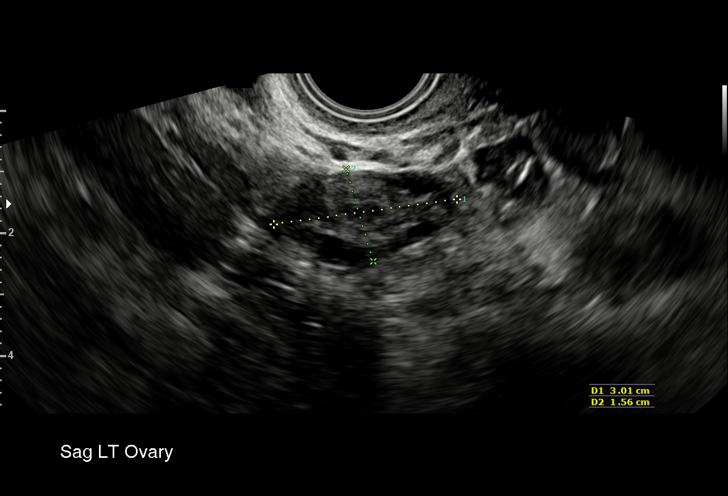
[im 52/62]
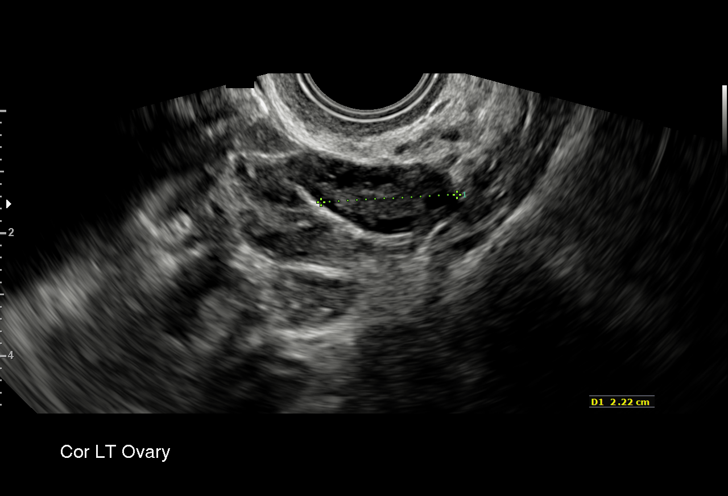
[im 57/62]
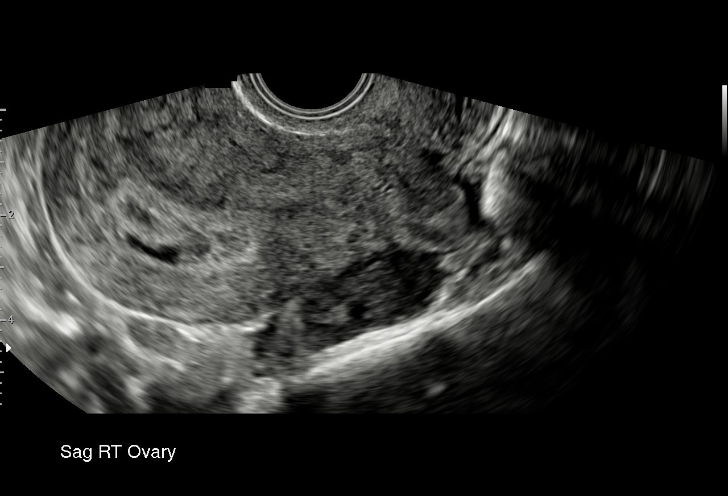
[im 62/62]
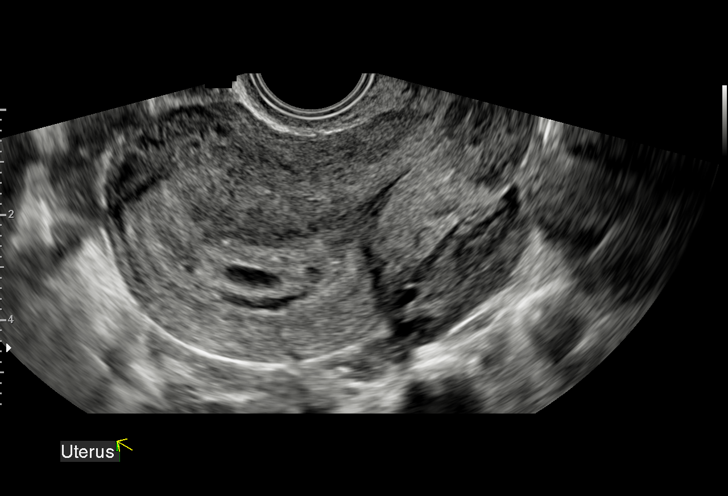

[15 of 28 positions shown; findings below may reference images not displayed]

FINDINGS: Intrauterine gestational sac: Single

Yolk sac:  Visualized

Embryo:  Visualized

Cardiac Activity: Visualized

Heart Rate: 117  bpm

MSD:   mm    w     d

CRL:  5.3  mm   6 w   1 d                  US EDC: 02/21/2018

Subchorionic hemorrhage:  Tiny subchorionic hemorrhage

Maternal uterus/adnexae: No adnexal mass.  Trace free fluid.
IMPRESSION: Six week 1 day intrauterine pregnancy. Fetal heart rate 117 beats
per minute. Very small subchorionic hemorrhage.

## 2019-02-17 ENCOUNTER — Emergency Department (HOSPITAL_COMMUNITY)
Admission: EM | Admit: 2019-02-17 | Discharge: 2019-02-18 | Disposition: A | Discharge status: Home / Self Care | Payer: Medicaid Other | Attending: Emergency Medicine | Admitting: Emergency Medicine

## 2019-02-17 ENCOUNTER — Other Ambulatory Visit: Payer: Self-pay

## 2019-02-17 ENCOUNTER — Encounter (HOSPITAL_COMMUNITY): Payer: Self-pay | Admitting: *Deleted

## 2019-02-17 DIAGNOSIS — R102 Pelvic and perineal pain: Secondary | ICD-10-CM | POA: Diagnosis not present

## 2019-02-17 DIAGNOSIS — Z113 Encounter for screening for infections with a predominantly sexual mode of transmission: Secondary | ICD-10-CM | POA: Diagnosis not present

## 2019-02-17 DIAGNOSIS — R197 Diarrhea, unspecified: Secondary | ICD-10-CM | POA: Insufficient documentation

## 2019-02-17 DIAGNOSIS — Z87891 Personal history of nicotine dependence: Secondary | ICD-10-CM | POA: Diagnosis not present

## 2019-02-17 DIAGNOSIS — N739 Female pelvic inflammatory disease, unspecified: Secondary | ICD-10-CM | POA: Insufficient documentation

## 2019-02-17 DIAGNOSIS — R1032 Left lower quadrant pain: Secondary | ICD-10-CM | POA: Diagnosis present

## 2019-02-17 DIAGNOSIS — R112 Nausea with vomiting, unspecified: Secondary | ICD-10-CM | POA: Diagnosis not present

## 2019-02-17 DIAGNOSIS — Z79899 Other long term (current) drug therapy: Secondary | ICD-10-CM | POA: Insufficient documentation

## 2019-02-17 DIAGNOSIS — N730 Acute parametritis and pelvic cellulitis: Secondary | ICD-10-CM

## 2019-02-17 MED ORDER — SODIUM CHLORIDE 0.9% FLUSH: 3.0000 mL | Freq: Once | INTRAVENOUS | Status: DC

## 2019-02-17 MED ORDER — ONDANSETRON 4 MG PO TBDP
4.0000 mg | ORAL_TABLET | Freq: Once | ORAL | Status: AC | PRN
  Administered 2019-02-18: 4 mg via ORAL
  Filled 2019-02-17: qty 1

## 2019-02-17 NOTE — ED Triage Notes (Signed)
Pt reports onset of mid abd pain around 8pm, having n/v/d.

## 2019-02-18 ENCOUNTER — Emergency Department (HOSPITAL_COMMUNITY): Payer: Medicaid Other

## 2019-02-18 LAB — URINALYSIS, ROUTINE W REFLEX MICROSCOPIC
Bacteria, UA: NONE SEEN
Bilirubin Urine: NEGATIVE
Glucose, UA: NEGATIVE mg/dL
Ketones, ur: 80 mg/dL — AB
Nitrite: NEGATIVE
Protein, ur: 100 mg/dL — AB
RBC / HPF: >50 RBC/hpf — ABNORMAL HIGH (ref 0–5)
Specific Gravity, Urine: 1.031 — ABNORMAL HIGH (ref 1.005–1.030)
pH: 6 (ref 5.0–8.0)

## 2019-02-18 LAB — COMPREHENSIVE METABOLIC PANEL
ALT: 11 U/L (ref 0–44)
AST: 21 U/L (ref 15–41)
Albumin: 4.6 g/dL (ref 3.5–5.0)
Alkaline Phosphatase: 58 U/L (ref 38–126)
Anion gap: 17 — ABNORMAL HIGH (ref 5–15)
BUN: 13 mg/dL (ref 6–20)
CO2: 19 mmol/L — ABNORMAL LOW (ref 22–32)
Calcium: 9.6 mg/dL (ref 8.9–10.3)
Chloride: 104 mmol/L (ref 98–111)
Creatinine, Ser: 0.89 mg/dL (ref 0.44–1.00)
GFR calc Af Amer: >60 mL/min (ref >60)
GFR calc non Af Amer: >60 mL/min (ref >60)
Glucose, Bld: 167 mg/dL — ABNORMAL HIGH (ref 70–99)
Potassium: 3.6 mmol/L (ref 3.5–5.1)
Sodium: 140 mmol/L (ref 135–145)
Total Bilirubin: 0.5 mg/dL (ref 0.3–1.2)
Total Protein: 8.1 g/dL (ref 6.5–8.1)

## 2019-02-18 LAB — LIPASE, BLOOD: Lipase: 25 U/L (ref 11–51)

## 2019-02-18 LAB — CBC
HCT: 36.4 % (ref 36.0–46.0)
Hemoglobin: 11.6 g/dL — ABNORMAL LOW (ref 12.0–15.0)
MCH: 26 pg (ref 26.0–34.0)
MCHC: 31.9 g/dL (ref 30.0–36.0)
MCV: 81.6 fL (ref 80.0–100.0)
Platelets: 405 10*3/uL — ABNORMAL HIGH (ref 150–400)
RBC: 4.46 MIL/uL (ref 3.87–5.11)
RDW: 15.5 % (ref 11.5–15.5)
WBC: 16 10*3/uL — ABNORMAL HIGH (ref 4.0–10.5)
nRBC: 0 % (ref 0.0–0.2)

## 2019-02-18 LAB — I-STAT BETA HCG BLOOD, ED (MC, WL, AP ONLY): I-stat hCG, quantitative: <5 m[IU]/mL (ref <5)

## 2019-02-18 MED ORDER — DOXYCYCLINE HYCLATE 100 MG PO CAPS: 100.0000 mg | ORAL_CAPSULE | Freq: Two times a day (BID) | ORAL | 0 refills | Status: AC

## 2019-02-18 MED ORDER — ONDANSETRON 4 MG PO TBDP
4.0000 mg | ORAL_TABLET | Freq: Once | ORAL | Status: AC | PRN
  Administered 2019-02-18: 4 mg via ORAL
  Filled 2019-02-18: qty 1

## 2019-02-18 MED ORDER — HYDROCODONE-ACETAMINOPHEN 5-325 MG PO TABS: 1.0000 | ORAL_TABLET | ORAL | 0 refills | Status: AC | PRN

## 2019-02-18 MED ORDER — CEFTRIAXONE SODIUM 250 MG IJ SOLR
250.0000 mg | Freq: Once | INTRAMUSCULAR | Status: AC
  Administered 2019-02-18: 250 mg via INTRAMUSCULAR
  Filled 2019-02-18: qty 250

## 2019-02-18 MED ORDER — METRONIDAZOLE 500 MG PO TABS: 500.0000 mg | ORAL_TABLET | Freq: Two times a day (BID) | ORAL | 0 refills | Status: AC

## 2019-02-18 MED ORDER — LIDOCAINE HCL (PF) 1 % IJ SOLN
INTRAMUSCULAR | Status: AC
  Administered 2019-02-18: 5 mL
  Filled 2019-02-18: qty 5

## 2019-02-18 MED ORDER — HYDROCODONE-ACETAMINOPHEN 5-325 MG PO TABS
1.0000 | ORAL_TABLET | Freq: Once | ORAL | Status: AC
  Administered 2019-02-18: 1 via ORAL
  Filled 2019-02-18: qty 1

## 2019-02-18 MED ORDER — AZITHROMYCIN 250 MG PO TABS
1000.0000 mg | ORAL_TABLET | Freq: Once | ORAL | Status: AC
  Administered 2019-02-18: 1000 mg via ORAL
  Filled 2019-02-18: qty 4

## 2019-02-18 MED ORDER — STERILE WATER FOR INJECTION IJ SOLN
INTRAMUSCULAR | Status: AC
  Filled 2019-02-18: qty 10

## 2019-02-18 NOTE — Discharge Instructions (Signed)
It is extremely important to follow up with GYN for recheck and repeat ultrasound in one week.   Take medications as prescribed. If you develop high fever, severe pain or new concern, please return to the emergency department for further management.

## 2019-02-18 NOTE — ED Provider Notes (Signed)
[facility] Provider Note   CSN: [id number] Arrival date & time: 02/17/19  2343     History   Chief Complaint Chief Complaint  Patient presents with  . Abdominal Pain  . Emesis  . Diarrhea    HPI Kimberly Humphrey is a 28 y.o. female.     Patient to ED with complaint of left lower abdominal pain, radiating through to the back, since around 8:00 pm last night. No fever but she has experienced nausea with vomiting. She has had a vaginal discharge for the past week. No frequency of urination or dysuria. No congestion, cough.   The history is provided by the patient. No language interpreter was used.  Abdominal Pain Associated symptoms: diarrhea, nausea, vaginal discharge and vomiting   Associated symptoms: no chills and no fever   Emesis Associated symptoms: abdominal pain and diarrhea   Associated symptoms: no chills and no fever   Diarrhea Associated symptoms: abdominal pain and vomiting   Associated symptoms: no chills and no fever     Past Medical History:  Diagnosis Date  . Anemia     Patient Active Problem List   Diagnosis Date Noted  . Normal labor 02/11/2018  . Anemia affecting pregnancy in third trimester 11/29/2017  . Supervision of normal first pregnancy, antepartum 08/14/2017    Past Surgical History:  Procedure Laterality Date  . NO PAST SURGERIES       OB History    Gravida  1   Para  1   Term  1   Preterm  0   AB  0   Living  1     SAB  0   TAB  0   Ectopic  0   Multiple  0   Live Births  1            Home Medications    Prior to Admission medications   Medication Sig Start Date End Date Taking? Authorizing Provider  acetaminophen (TYLENOL) 500 MG tablet Take 1,000 mg by mouth every 8 (eight) hours as needed for mild pain.    [provider]  diphenhydrAMINE (BENADRYL) 25 MG tablet Take 25 mg by mouth every 6 (six) hours as needed for sleep.    [provider]   etonogestrel-ethinyl estradiol (NUVARING) 0.12-0.015 MG/24HR vaginal ring INSERT 1 RING VAGINALLY AND LEAVE IN PLACE FOR 3 CONSECUTIVE WEEKS, THEN REMOVE FOR 1 WEEK 09/06/18   Thressa ShellerHogan, Heather D, CNM  ferrous sulfate (FERROUSUL) 325 (65 FE) MG tablet Take 1 tablet (325 mg total) by mouth daily with breakfast. Patient not taking: Reported on 03/25/2018 02/04/18   Allie Bossierove, Myra C, MD  HYDROcodone-acetaminophen (NORCO/VICODIN) 5-325 MG tablet Take 1 tablet by mouth every 6 (six) hours as needed for moderate pain. 05/27/18   Lawyer, Cristal Deerhristopher, PA-C  ibuprofen (ADVIL,MOTRIN) 600 MG tablet TAKE 1 TABLET(600 MG) BY MOUTH EVERY 6 HOURS AS NEEDED FOR MODERATE PAIN OR CRAMPING 07/26/18   Constant, Gigi GinPeggy, MD  predniSONE (DELTASONE) 50 MG tablet Take 1 tablet (50 mg total) by mouth daily with breakfast. 05/27/18   Lawyer, Cristal Deerhristopher, PA-C  Prenatal Vit-DSS-Fe Fum-FA (PRENATAL 19) tablet Take 1 tablet by mouth daily. Patient not taking: Reported on 05/27/2018 10/11/17   Armando ReichertHogan, Heather D, CNM    Family History History reviewed. No pertinent family history.  Social History Social History   Tobacco Use  . Smoking status: Former Games developermoker  . Smokeless tobacco: Never Used  Substance Use Topics  . Alcohol  use: Yes    Frequency: Never    Comment: Not in 3 months  . Drug use: Yes    Frequency: 1.0 times per week    Types: Marijuana    Comment: Last use 11/9     Allergies   Patient has no known allergies.   Review of Systems Review of Systems  Constitutional: Negative for chills and fever.  HENT: Negative.   Respiratory: Negative.   Cardiovascular: Negative.   Gastrointestinal: Positive for abdominal pain, diarrhea, nausea and vomiting.  Genitourinary: Positive for pelvic pain and vaginal discharge.  Musculoskeletal: Positive for back pain.  Skin: Negative.   Neurological: Negative.      Physical Exam Updated Vital Signs BP (!) 141/75 (BP Location: Right Arm)   Pulse 95   Temp 98 F (36.7 C)  (Oral)   Resp 20   Ht 5\' 5"  (1.651 m)   LMP 01/21/2019   SpO2 100%   BMI 21.97 kg/m   Physical Exam Constitutional:      Appearance: She is well-developed.  HENT:     Head: Normocephalic.  Neck:     Musculoskeletal: Normal range of motion and neck supple.  Cardiovascular:     Rate and Rhythm: Normal rate and regular rhythm.  Pulmonary:     Effort: Pulmonary effort is normal.     Breath sounds: Normal breath sounds.  Abdominal:     General: Bowel sounds are normal.     Palpations: Abdomen is soft.     Tenderness: There is abdominal tenderness in the left lower quadrant. There is guarding. There is no rebound.  Genitourinary:    Comments: There is copious green thick discharge collected in vaginal vault. Moderate CMT and left adnexal tenderness. External vagina unremarkable.  Musculoskeletal: Normal range of motion.  Skin:    General: Skin is warm and dry.     Findings: No rash.  Neurological:     Mental Status: She is alert.     Cranial Nerves: No cranial nerve deficit.      ED Treatments / Results  Labs (all labs ordered are listed, but only abnormal results are displayed) Labs Reviewed  COMPREHENSIVE METABOLIC PANEL - Abnormal; Notable for the following components:      Result Value   CO2 19 (*)    Glucose, Bld 167 (*)    Anion gap 17 (*)    All other components within normal limits  CBC - Abnormal; Notable for the following components:   WBC 16.0 (*)    Hemoglobin 11.6 (*)    Platelets 405 (*)    All other components within normal limits  URINALYSIS, ROUTINE W REFLEX MICROSCOPIC - Abnormal; Notable for the following components:   APPearance HAZY (*)    Specific Gravity, Urine 1.031 (*)    Hgb urine dipstick LARGE (*)    Ketones, ur 80 (*)    Protein, ur 100 (*)    Leukocytes,Ua TRACE (*)    RBC / HPF >50 (*)    All other components within normal limits  WET PREP, GENITAL  LIPASE, BLOOD  I-STAT BETA HCG BLOOD, ED ([facility], [facility], [facility] ONLY)  GC/CHLAMYDIA PROBE  AMP ([facility]) NOT AT [facility]    EKG None  Radiology No results found.  Procedures Procedures (including critical care time)  Medications Ordered in ED Medications  sodium chloride flush (NS) 0.9 % injection 3 mL (has no administration in time range)  ondansetron (ZOFRAN-ODT) disintegrating tablet 4 mg (4 mg Oral Given 02/18/19 0000)  ondansetron (ZOFRAN-ODT) disintegrating tablet 4 mg (4 mg Oral Given 02/18/19 0053)     Initial Impression / Assessment and Plan / ED Course  I have reviewed the triage vital signs and the nursing notes.  Pertinent labs & imaging results that were available during my care of the patient were reviewed by me and considered in my medical decision making (see chart for details).        Patient to ED with lower abdominal pain, nausea, vomiting, and vaginal discharge. No fever.   Suspicion for PID based on symptoms and exam. She has a leukocytosis of 16 as well. Pelvic US c/w PID, possible small abscess present. She has been given zithromax and Rocephin in the ED and will be discharged home with Rx Flagyl, Doxycycline and Norco. Discussed findings and diagnosis with the patient as well as importance of follow up with GYN in one week for repeat ultrasound.   Final Clinical Impressions(s) / ED Diagnoses   Final diagnoses:  None   1. PID  ED Discharge Orders    None       Elpidio AnisUpstill, Livie Vanderhoof, Cordelia Poche-C 02/18/19 [id number]    Dione BoozeGlick, David, MD 02/18/19 [phone]

## 2019-02-19 ENCOUNTER — Telehealth: Payer: Medicaid Other

## 2019-02-19 ENCOUNTER — Other Ambulatory Visit: Payer: Self-pay

## 2019-02-19 ENCOUNTER — Emergency Department (HOSPITAL_COMMUNITY)
Admission: EM | Admit: 2019-02-19 | Discharge: 2019-02-20 | Disposition: A | Discharge status: Home / Self Care | Payer: Medicaid Other | Attending: Emergency Medicine | Admitting: Emergency Medicine

## 2019-02-19 ENCOUNTER — Encounter (HOSPITAL_COMMUNITY): Payer: Self-pay | Admitting: Emergency Medicine

## 2019-02-19 DIAGNOSIS — R112 Nausea with vomiting, unspecified: Secondary | ICD-10-CM | POA: Diagnosis present

## 2019-02-19 DIAGNOSIS — Z87891 Personal history of nicotine dependence: Secondary | ICD-10-CM | POA: Insufficient documentation

## 2019-02-19 DIAGNOSIS — Z79899 Other long term (current) drug therapy: Secondary | ICD-10-CM | POA: Insufficient documentation

## 2019-02-19 LAB — URINALYSIS, ROUTINE W REFLEX MICROSCOPIC
Bilirubin Urine: NEGATIVE
Glucose, UA: NEGATIVE mg/dL
Hgb urine dipstick: NEGATIVE
Ketones, ur: 20 mg/dL — AB
Nitrite: NEGATIVE
Protein, ur: NEGATIVE mg/dL
Specific Gravity, Urine: 1.021 (ref 1.005–1.030)
pH: 5 (ref 5.0–8.0)

## 2019-02-19 LAB — I-STAT BETA HCG BLOOD, ED (MC, WL, AP ONLY): I-stat hCG, quantitative: <5 m[IU]/mL (ref <5)

## 2019-02-19 LAB — CBC
HCT: 38.5 % (ref 36.0–46.0)
Hemoglobin: 12.2 g/dL (ref 12.0–15.0)
MCH: 26.1 pg (ref 26.0–34.0)
MCHC: 31.7 g/dL (ref 30.0–36.0)
MCV: 82.4 fL (ref 80.0–100.0)
Platelets: 319 10*3/uL (ref 150–400)
RBC: 4.67 MIL/uL (ref 3.87–5.11)
RDW: 15.3 % (ref 11.5–15.5)
WBC: 7.9 10*3/uL (ref 4.0–10.5)
nRBC: 0 % (ref 0.0–0.2)

## 2019-02-19 LAB — COMPREHENSIVE METABOLIC PANEL
ALT: 11 U/L (ref 0–44)
AST: 19 U/L (ref 15–41)
Albumin: 4.4 g/dL (ref 3.5–5.0)
Alkaline Phosphatase: 51 U/L (ref 38–126)
Anion gap: 15 (ref 5–15)
BUN: 16 mg/dL (ref 6–20)
CO2: 22 mmol/L (ref 22–32)
Calcium: 9.4 mg/dL (ref 8.9–10.3)
Chloride: 102 mmol/L (ref 98–111)
Creatinine, Ser: 0.76 mg/dL (ref 0.44–1.00)
GFR calc Af Amer: >60 mL/min (ref >60)
GFR calc non Af Amer: >60 mL/min (ref >60)
Glucose, Bld: 78 mg/dL (ref 70–99)
Potassium: 3.1 mmol/L — ABNORMAL LOW (ref 3.5–5.1)
Sodium: 139 mmol/L (ref 135–145)
Total Bilirubin: 0.9 mg/dL (ref 0.3–1.2)
Total Protein: 8 g/dL (ref 6.5–8.1)

## 2019-02-19 LAB — LIPASE, BLOOD: Lipase: 27 U/L (ref 11–51)

## 2019-02-19 LAB — GC/CHLAMYDIA PROBE AMP (~~LOC~~) NOT AT ARMC
Chlamydia: NEGATIVE
Neisseria Gonorrhea: NEGATIVE

## 2019-02-19 MED ORDER — SODIUM CHLORIDE 0.9% FLUSH: 3.0000 mL | Freq: Once | INTRAVENOUS | Status: DC

## 2019-02-19 NOTE — ED Triage Notes (Signed)
Patient with PID, diagnosed on 8/3.  She was told to come back if she vomited any of her antibiotics.  She states that the doxycycline did not stay down and she was not given any meds for nausea.

## 2019-02-20 ENCOUNTER — Encounter (HOSPITAL_COMMUNITY): Payer: Self-pay | Admitting: Student

## 2019-02-20 MED ORDER — ONDANSETRON 4 MG PO TBDP: 4.0000 mg | ORAL_TABLET | Freq: Three times a day (TID) | ORAL | 0 refills | Status: AC | PRN

## 2019-02-20 MED ORDER — POTASSIUM CHLORIDE CRYS ER 20 MEQ PO TBCR
40.0000 meq | EXTENDED_RELEASE_TABLET | Freq: Once | ORAL | Status: AC
  Administered 2019-02-20: 40 meq via ORAL
  Filled 2019-02-20: qty 2

## 2019-02-20 MED ORDER — ONDANSETRON 4 MG PO TBDP
4.0000 mg | ORAL_TABLET | Freq: Once | ORAL | Status: AC
  Administered 2019-02-20: 4 mg via ORAL
  Filled 2019-02-20: qty 1

## 2019-02-20 NOTE — ED Provider Notes (Signed)
[facility] Provider Note   CSN: [id number] Arrival date & time: 02/19/19  2202     History   Chief Complaint Chief Complaint  Patient presents with   Emesis    HPI Kimberly Humphrey is a 28 y.o. female with a hx of anemia who returns to the ED with complaints of nausea & emesis this evening. Patient seen in the ED 08/03- diagnosed w/ PID, discharged home with abx (doxy/flagyl) & pain control. States she took first dose of abx the morning of 08/05 with a meal & felt a bit nauseated but was able to keep them down. This evening she took the doxycycline, not specifically with food, and began to feel nauseated with subsequent 3 episodes of non bloody emesis. She states N/V improved @ present. No specific alleviating/aggravating factors other than the medications. She states her pelvic pain is substantially improved from her last visit, she feels the abx she received in the ED really helped, no pain currently, did take ibuprofen earlier today, has not needed the norco she was prescribed. Was not given nausea medicines to go home with. Continues to have vaginal discharge, but no acute worsening. Denies fever, chills, hematemesis, melena, hematochezia, diarrhea, dysuria, frequency, or urgency.    HPI  Past Medical History:  Diagnosis Date   Anemia     Patient Active Problem List   Diagnosis Date Noted   Normal labor 02/11/2018   Anemia affecting pregnancy in third trimester 11/29/2017   Supervision of normal first pregnancy, antepartum 08/14/2017    Past Surgical History:  Procedure Laterality Date   NO PAST SURGERIES       OB History    Gravida  1   Para  1   Term  1   Preterm  0   AB  0   Living  1     SAB  0   TAB  0   Ectopic  0   Multiple  0   Live Births  1            Home Medications    Prior to Admission medications   Medication Sig Start Date End Date Taking? Authorizing Provider  acetaminophen  (TYLENOL) 500 MG tablet Take 1,000 mg by mouth every 8 (eight) hours as needed for mild pain.    [provider]  diphenhydrAMINE (BENADRYL) 25 MG tablet Take 25 mg by mouth every 6 (six) hours as needed for sleep.    [provider]  doxycycline (VIBRAMYCIN) 100 MG capsule Take 1 capsule (100 mg total) by mouth 2 (two) times daily. 02/18/19   Elpidio AnisUpstill, Shari, PA-C  etonogestrel-ethinyl estradiol (NUVARING) 0.12-0.015 MG/24HR vaginal ring INSERT 1 RING VAGINALLY AND LEAVE IN PLACE FOR 3 CONSECUTIVE WEEKS, THEN REMOVE FOR 1 WEEK 09/06/18   Armando ReichertHogan, Heather D, CNM  ferrous sulfate (FERROUSUL) 325 (65 FE) MG tablet Take 1 tablet (325 mg total) by mouth daily with breakfast. Patient not taking: Reported on 03/25/2018 02/04/18   Allie Bossierove, Myra C, MD  HYDROcodone-acetaminophen (NORCO/VICODIN) 5-325 MG tablet Take 1 tablet by mouth every 4 (four) hours as needed for moderate pain or severe pain. 02/18/19   Elpidio AnisUpstill, Shari, PA-C  ibuprofen (ADVIL,MOTRIN) 600 MG tablet TAKE 1 TABLET(600 MG) BY MOUTH EVERY 6 HOURS AS NEEDED FOR MODERATE PAIN OR CRAMPING 07/26/18   Constant, Peggy, MD  metroNIDAZOLE (FLAGYL) 500 MG tablet Take 1 tablet (500 mg total) by mouth 2 (two) times daily. 02/18/19   Elpidio AnisUpstill, Shari, PA-C  predniSONE (DELTASONE) 50 MG tablet Take 1 tablet (50 mg total) by mouth daily with breakfast. 05/27/18   Lawyer, Cristal Deerhristopher, PA-C  Prenatal Vit-DSS-Fe Fum-FA (PRENATAL 19) tablet Take 1 tablet by mouth daily. Patient not taking: Reported on 05/27/2018 10/11/17   Armando ReichertHogan, Heather D, CNM    Family History No family history on file.  Social History Social History   Tobacco Use   Smoking status: Former Smoker   Smokeless tobacco: Never Used  Substance Use Topics   Alcohol use: Yes    Frequency: Never    Comment: Not in 3 months   Drug use: Yes    Frequency: 1.0 times per week    Types: Marijuana    Comment: Last use 11/9     Allergies   Patient has no known allergies.   Review of  Systems Review of Systems  Constitutional: Negative for chills and fever.  Respiratory: Negative for shortness of breath.   Cardiovascular: Negative for chest pain.  Gastrointestinal: Positive for nausea and vomiting. Negative for abdominal pain, anal bleeding, blood in stool, constipation and diarrhea.  Genitourinary: Positive for pelvic pain (improved substantially) and vaginal discharge. Negative for dysuria, frequency, hematuria and vaginal bleeding.  Neurological: Negative for weakness and numbness.  All other systems reviewed and are negative.    Physical Exam Updated Vital Signs BP 113/68 (BP Location: Right Arm)    Pulse 69    Temp 98.4 F (36.9 C) (Oral)    Resp 14    LMP 01/21/2019    SpO2 100%   Physical Exam Vitals signs and nursing note reviewed.  Constitutional:      General: She is not in acute distress.    Appearance: She is well-developed. She is not toxic-appearing.  HENT:     Head: Normocephalic and atraumatic.  Eyes:     General:        Right eye: No discharge.        Left eye: No discharge.     Conjunctiva/sclera: Conjunctivae normal.  Neck:     Musculoskeletal: Neck supple.  Cardiovascular:     Rate and Rhythm: Normal rate and regular rhythm.  Pulmonary:     Effort: Pulmonary effort is normal. No respiratory distress.     Breath sounds: Normal breath sounds. No wheezing, rhonchi or rales.  Abdominal:     General: There is no distension.     Palpations: Abdomen is soft.     Tenderness: There is no abdominal tenderness. There is no right CVA tenderness, left CVA tenderness, guarding or rebound.  Skin:    General: Skin is warm and dry.     Findings: No rash.  Neurological:     Mental Status: She is alert.     Comments: Clear speech.   Psychiatric:        Behavior: Behavior normal.    ED Treatments / Results  Labs (all labs ordered are listed, but only abnormal results are displayed) Labs Reviewed  COMPREHENSIVE METABOLIC PANEL - Abnormal;  Notable for the following components:      Result Value   Potassium 3.1 (*)    All other components within normal limits  URINALYSIS, ROUTINE W REFLEX MICROSCOPIC - Abnormal; Notable for the following components:   APPearance HAZY (*)    Ketones, ur 20 (*)    Leukocytes,Ua MODERATE (*)    Bacteria, UA MANY (*)    All other components within normal limits  LIPASE, BLOOD  CBC  I-STAT BETA HCG BLOOD, ED ([facility],  [facility], [facility] ONLY)    EKG None  Radiology [place] Pelvic Complete W Transvaginal And Torsion R/o  Result Date: 02/18/2019 CLINICAL DATA:  PID. EXAM: TRANSABDOMINAL AND TRANSVAGINAL ULTRASOUND OF PELVIS DOPPLER ULTRASOUND OF OVARIES TECHNIQUE: Both transabdominal and transvaginal ultrasound examinations of the pelvis were performed. Transabdominal technique was performed for global imaging of the pelvis including uterus, ovaries, adnexal regions, and pelvic cul-de-sac. It was necessary to proceed with endovaginal exam following the transabdominal exam to visualize the uterus and ovaries. Color and duplex Doppler ultrasound was utilized to evaluate blood flow to the ovaries. COMPARISON:  12/07/2017. FINDINGS: Uterus Measurements: 8.0 x 4.0 x 5.2 cm = volume: 88.1 mL. No fibroids or other mass visualized. Endometrium Thickness: 6.3 mm.  No focal abnormality visualized. Right ovary Measurements: 3.4 x 1.4 x 2.4 cm = volume: 5.8 mL. Normal appearance/no adnexal mass. Left ovary Measurements: 4.6 x 3.3 x 3.8 cm = volume: 29.4 mL. 2.5 x 2.5 x 2.4 cm complex cyst. Differential includes a complex physiological cyst, ectopic pregnancy, PID/tiny tubo-ovarian abscess. Pregnancy test to exclude ectopic pregnancy and follow-up exam to demonstrate resolution suggested. Pulsed Doppler evaluation of both ovaries demonstrates normal low-resistance arterial and venous waveforms. Other findings Trace free pelvic fluid. IMPRESSION: 1. 2.5 x 2.5 x 2.4 cm complex left ovarian cyst. Differential includes a complex visualized GB  cyst, ectopic pregnancy, PID/tiny tubo-ovarian abscess. Pregnancy test to exclude ectopic pregnancy and follow-up exam to demonstrate resolution suggested. Short-interval follow up ultrasound in 6-12 weeks is recommended, preferably during the week following the patient's normal menses. 2.  No evidence of ovarian torsion. 3.  Trace free pelvic fluid. Electronically Signed   By: Maisie Fushomas  Register   On: 02/18/2019 05:59    Procedures Procedures (including critical care time)  Medications Ordered in ED Medications  sodium chloride flush (NS) 0.9 % injection 3 mL (has no administration in time range)     Initial Impression / Assessment and Plan / ED Course  I have reviewed the triage vital signs and the nursing notes.  Pertinent labs & imaging results that were available during my care of the patient were reviewed by me and considered in my medical decision making (see chart for details).    Patient returns to the ED with complaints of N/V after taking doxycycline on somewhat of an empty stomach. She is nontoxic appearing, resting comfortably, vitals WNL. Recent ED visit reviewed- seen 08/03 & diagnosed w/ PID, US w/ possible small abscess present--> Azithro & Rocephin given in the ER. DC home with doxy, flagyl, & norco with plan for gyn f/u for repeat US within 1 week.  Work-up per triage team reviewed:  CBC: No leukocytosis or anemia.  CMP: Hypokalemia @ 3.1. No significant electrolyte derangement. Renal function & LFTs WNL Lipase: WNL UA: Moderate leukocytes, many bacteria, suspect more of a vaginal discharge issue, no specific urinary sxs, will culture given worse than prior UA, will not tx for UTI currently.Mild ketonuria.  Preg test: Negative.   Hypokalemia- oral replacement.  Patient has had some symptomatic improvement since last ED visit- improved pain specifically, she is afebrile today, her initial leukocytosis is normalized, do not feel she requires hospitalization for PID at this  time. Abdomen is soft, non tender- specifically no suprapubic tenderness, no peritoneal signs, do not feel pelvic exam needs repeating & patient is in agreement. I do not suspect alternative etiology such as appendicitis, cholecystitis, pancreatitis, obstruction, or perf. Likely medication side effect induced N/V. Given ODT zofran in the  ED & able to tolerate PO & is requesting discharge.. Encouraged her to take medicines with meals, prescription for zofran PRN, will need gyn f/u as initially planned. I discussed results, treatment plan, need for follow-up, and return precautions with the patient. Provided opportunity for questions, patient confirmed understanding and is in agreement with plan.    Final Clinical Impressions(s) / ED Diagnoses   Final diagnoses:  Non-intractable vomiting with nausea, unspecified vomiting type    ED Discharge Orders         Ordered    ondansetron (ZOFRAN ODT) 4 MG disintegrating tablet  Every 8 hours PRN     02/20/19 0439           Danaja Lasota, Glynda Jaeger, PA-C 16/10/96 0454    Delora Fuel, MD 09/81/19 [phone]

## 2019-02-20 NOTE — Discharge Instructions (Addendum)
You were seen in the emergency department today for nausea and vomiting. Your labs show that your potassium was a bit low, we replaced this in the ER, please be sure to include potassium rich foods in your diet per the attached guidelines.  Your urine showed some ketones meaning you are likely somewhat dehydrated, please be sure to drink plenty of water.  These are each things are to be rechecked by primary care within 1 to 2 weeks.  Be sure to take your medications with a meal in the morning and in the evening.  We are sending you home with Zofran to take every 8 hours as needed for nausea and vomiting.  We have prescribed you new medication(s) today. Discuss the medications prescribed today with your pharmacist as they can have adverse effects and interactions with your other medicines including over the counter and prescribed medications. Seek medical evaluation if you start to experience new or abnormal symptoms after taking one of these medicines, seek care immediately if you start to experience difficulty breathing, feeling of your throat closing, facial swelling, or rash as these could be indications of a more serious allergic reaction  Please follow-up with OB/GYN as initially discussed.  Please return to the ER for new or worsening symptoms including but not limited to fever, inability to keep fluids down, vomiting blood, passing out, or any other concerns.

## 2019-02-21 LAB — URINE CULTURE: Culture: NO GROWTH

## 2019-03-24 IMAGING — US US MFM OB COMP +14 WKS
1 series · 14 of 28 positions shown · non-contrast
Comparison: none

[Series 1: us mfm ob comp +14 wks · 89 acquisitions, 14 frames shown]
[im 4/89]
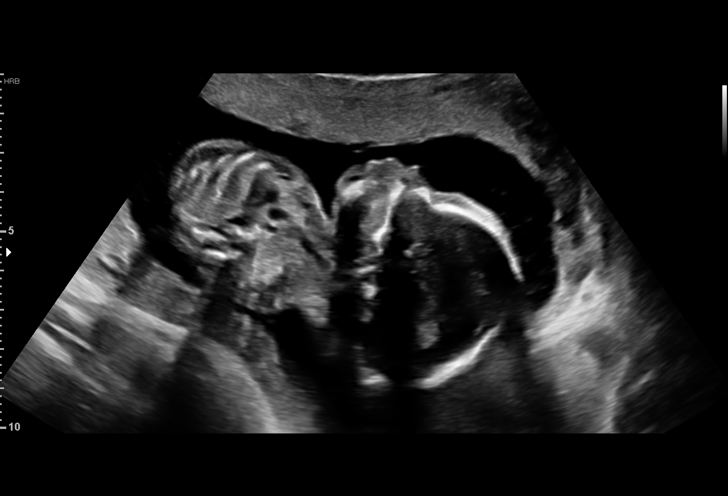
[im 10/89]
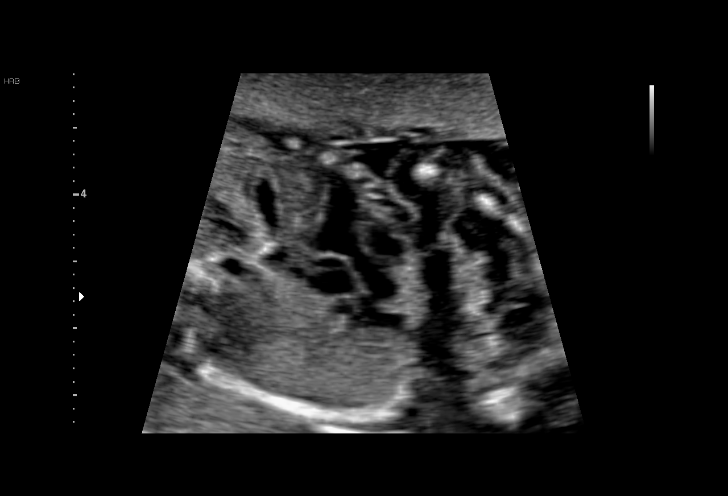
[im 17/89]
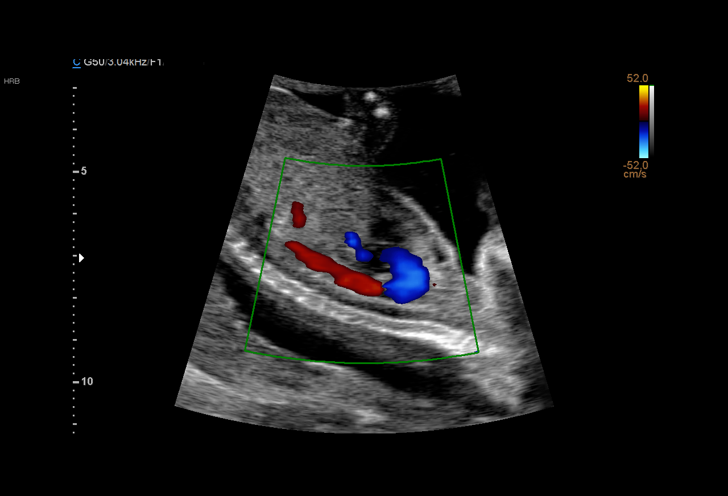
[im 23/89]
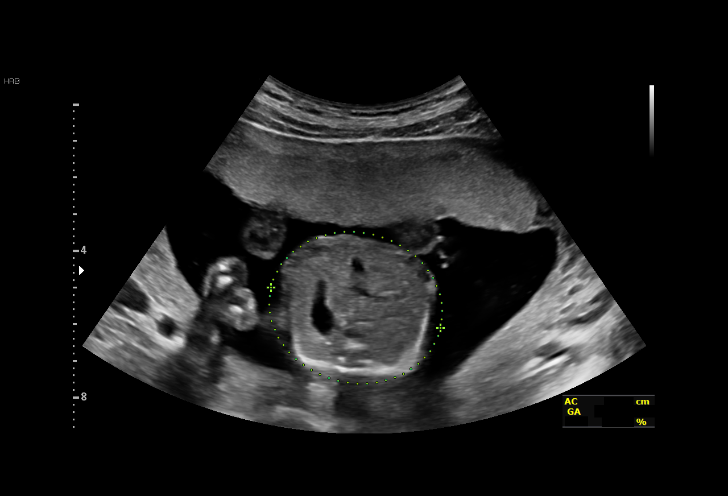
[im 30/89]
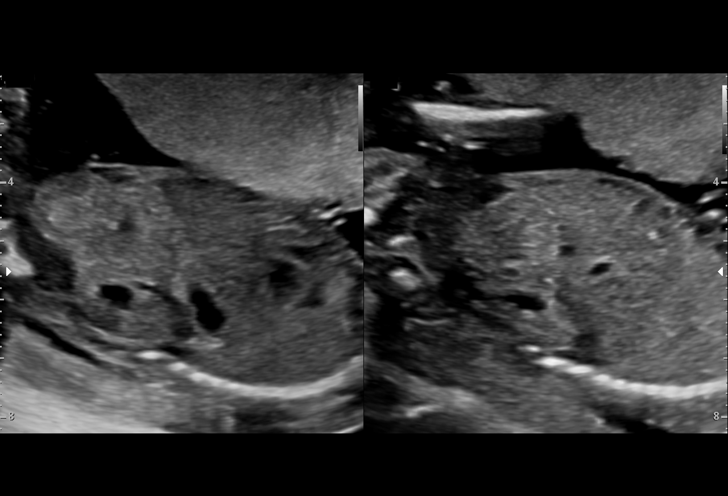
[im 36/89]
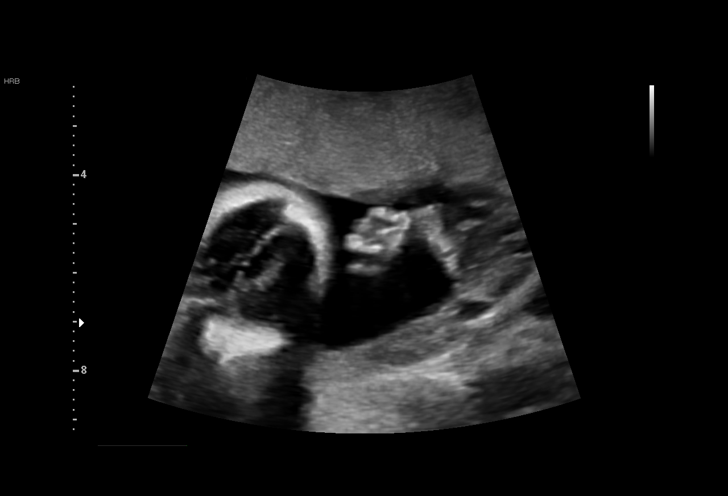
[im 43/89]
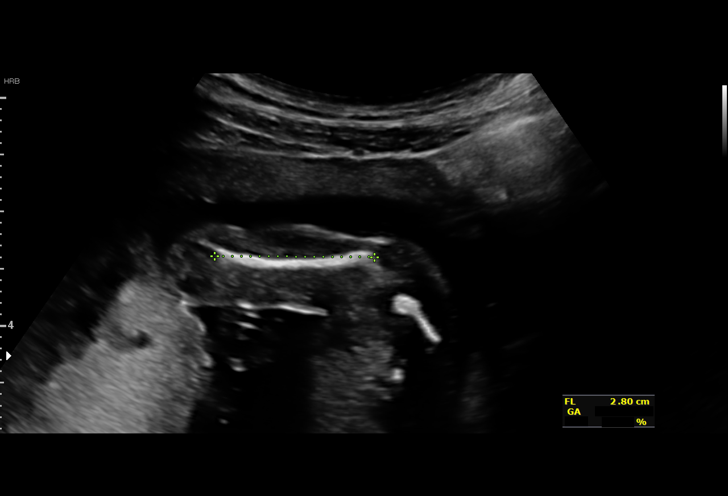
[im 49/89]
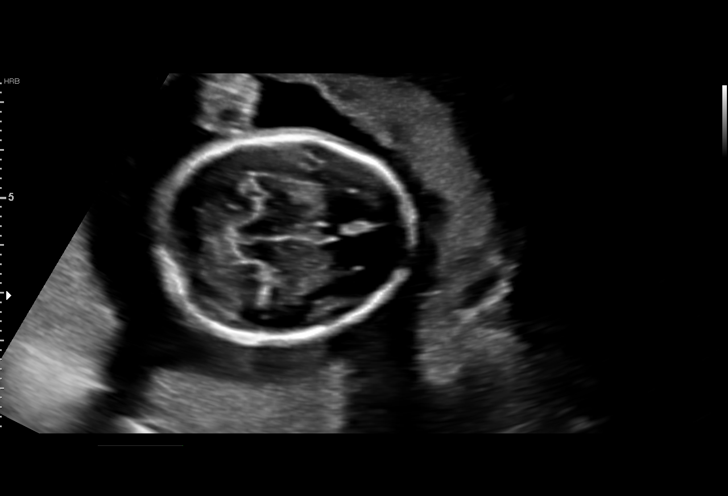
[im 56/89]
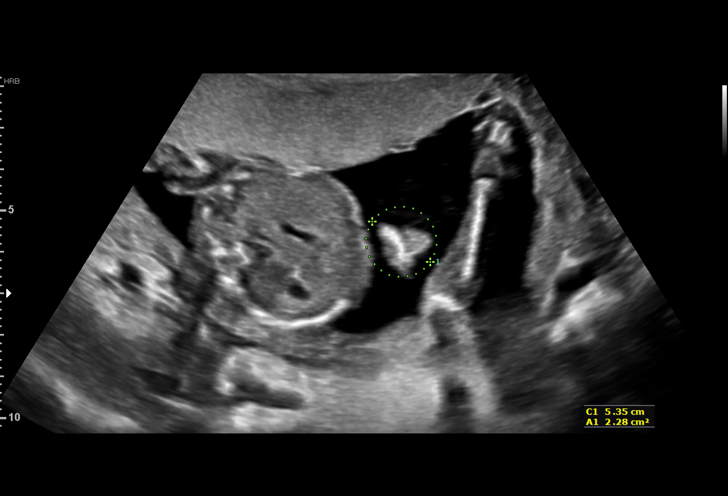
[im 62/89]
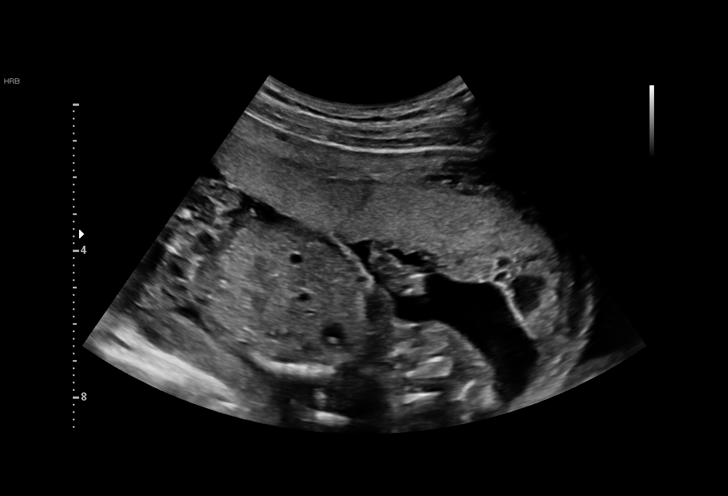
[im 69/89]
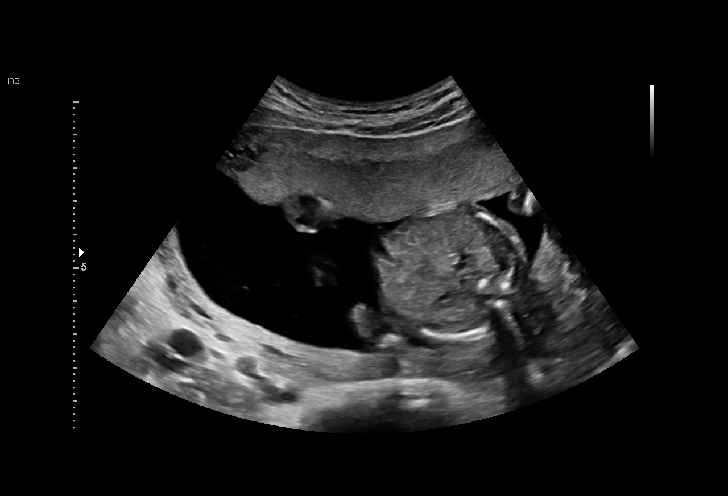
[im 75/89]
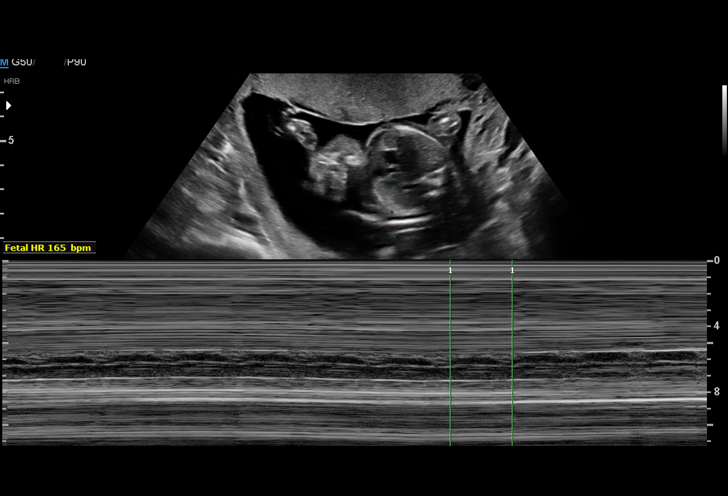
[im 82/89]
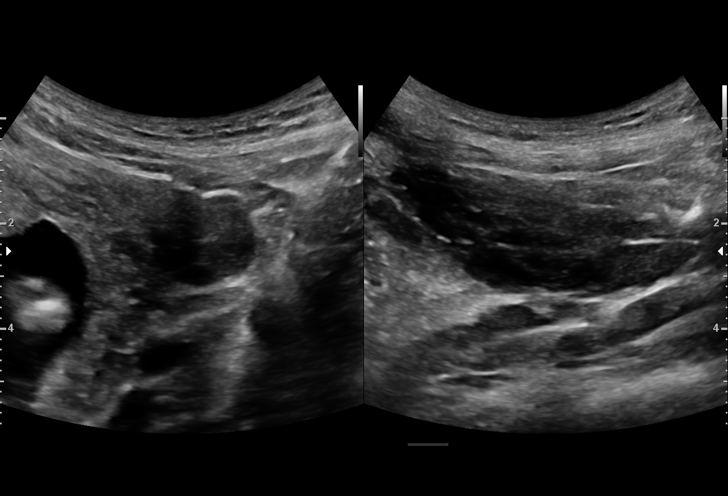
[im 89/89]
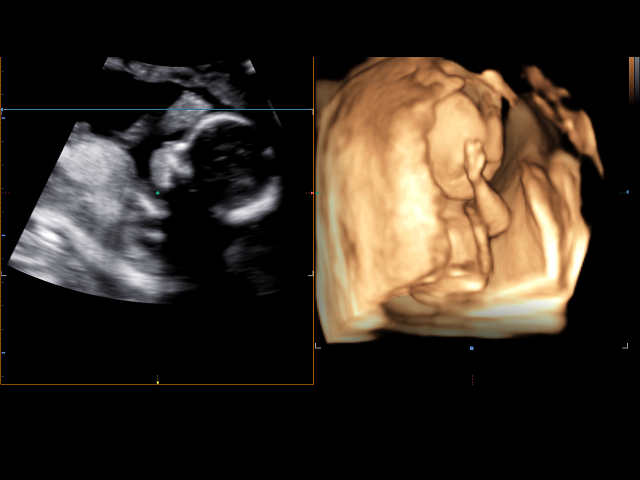

[14 of 28 positions shown; findings below may reference images not displayed]

[REDACTED]. [HOSPITAL],

1  LARS GAONA            [id number]      [id number]     [id number]
Indications

19 weeks gestation of pregnancy
Encounter for fetal anatomic survey
OB History

Blood Type:            Height:  5'5"   Weight (lb):  132       BMI:
Gravidity:    1
Fetal Evaluation

Num Of Fetuses:     1
Fetal Heart         165
Rate(bpm):
Cardiac Activity:   Observed
Presentation:       Cephalic Oblique
Placenta:           Anterior, above cervical os
P. Cord Insertion:  Visualized, central

Amniotic Fluid
AFI FV:      Subjectively within normal limits

Largest Pocket(cm)
5.33
Biometry

BPD:      41.3  mm     G. Age:  18w 3d         29  %    CI:         73.3   %    70 - 86
FL/HC:      18.5   %    16.1 -
HC:      153.3  mm     G. Age:  18w 2d         15  %    HC/AC:      1.10        1.09 -
AC:      139.6  mm     G. Age:  19w 3d         58  %    FL/BPD:     68.5   %
FL:       28.3  mm     G. Age:  18w 5d         32  %    FL/AC:      20.3   %    20 - 24
HUM:      29.9  mm     G. Age:  19w 6d         71  %
CER:      18.4  mm     G. Age:  18w 1d         26  %
NFT:       3.1  mm
CM:        6.1  mm

Est. FW:     266  gm      0 lb 9 oz     44  %
Gestational Age

LMP:           19w 0d        Date:  05/15/17                 EDD:   02/19/18
U/S Today:     18w 5d                                        EDD:   02/21/18
Best:          19w 0d     Det. By:  LMP  (05/15/17)          EDD:   02/19/18
Anatomy

Cranium:               Appears normal         Aortic Arch:            Appears normal
Cavum:                 Appears normal         Ductal Arch:            Appears normal
Ventricles:            Appears normal         Diaphragm:              Appears normal
Choroid Plexus:        Appears normal         Stomach:                Appears normal, left
sided
Cerebellum:            Appears normal         Abdomen:                Appears normal
Posterior Fossa:       Appears normal         Abdominal Wall:         Appears nml (cord
insert, abd wall)
Nuchal Fold:           Appears normal         Cord Vessels:           Appears normal (3
vessel cord)
Face:                  Appears normal         Kidneys:                See comments
(orbits and profile)
Lips:                  Appears normal         Bladder:                Appears normal
Thoracic:              Appears normal         Spine:                  Not well visualized
Heart:                 Appears normal         Upper Extremities:      Appears normal
(4CH, axis, and situs
RVOT:                  Appears normal         Lower Extremities:      Appears normal
LVOT:                  Appears normal

Other:  Parents do not wish to know sex of fetus. Heels and 5th digit
visualized. Open hands visualized. Nasal bone visualized. Technically
difficult due to fetal position.
Cervix Uterus Adnexa

Cervix
Length:           3.54  cm.
Normal appearance by transabdominal scan.

Uterus
No abnormality visualized.

Left Ovary
Size(cm)       2.8  x   1.2    x  1.9       Vol(ml):
Within normal limits.

Right Ovary
Size(cm)       4.2  x   1.9    x  2.2       Vol(ml):
Within normal limits.

Cul De Sac:   No free fluid seen.

Adnexa:       No adnexal mass visualized.
Impression

SIUP at 19+0 weeks with cardiac activity
Normal detailed fetal anatomy; limited views of spine and
renal pelves (? mild pyelectasis)
Markers of aneuploidy: none
Normal amniotic fluid volume
Measurements consistent with LMP dating
Recommendations

Follow-up ultrasound in 4-6 weeks to complete anatomy
survey and reassess kidneys

## 2019-05-11 IMAGING — US US MFM OB FOLLOW-UP
1 series · 14 of 28 positions shown · non-contrast
Comparison: none

[Series 1: us mfm ob follow-up · 50 acquisitions, 14 frames shown]
[im 2/50]
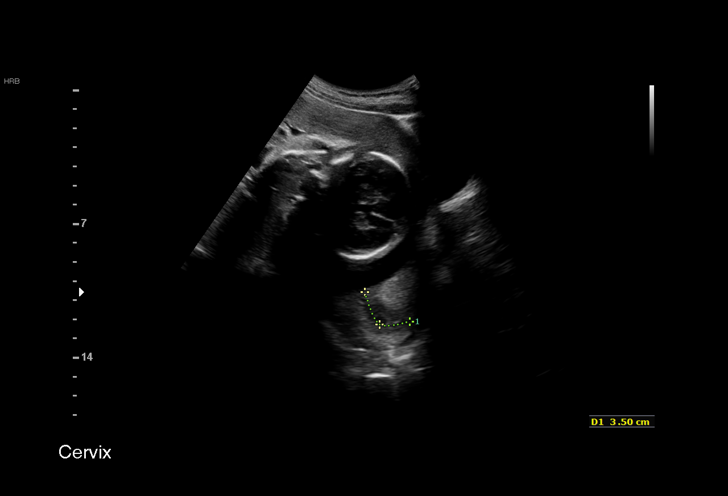
[im 6/50]
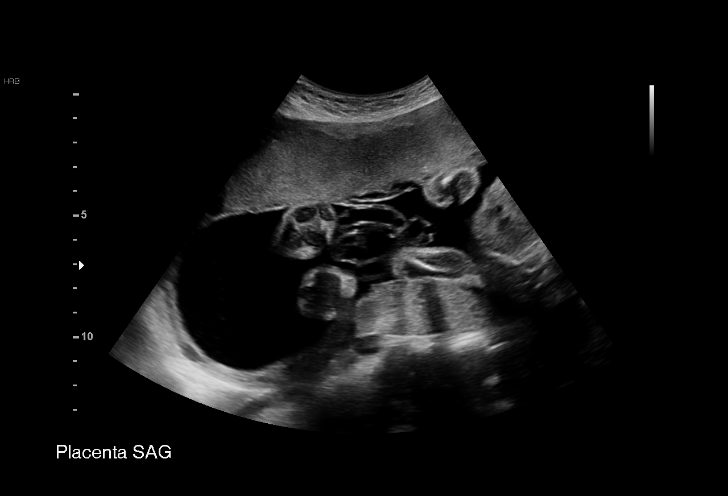
[im 10/50]
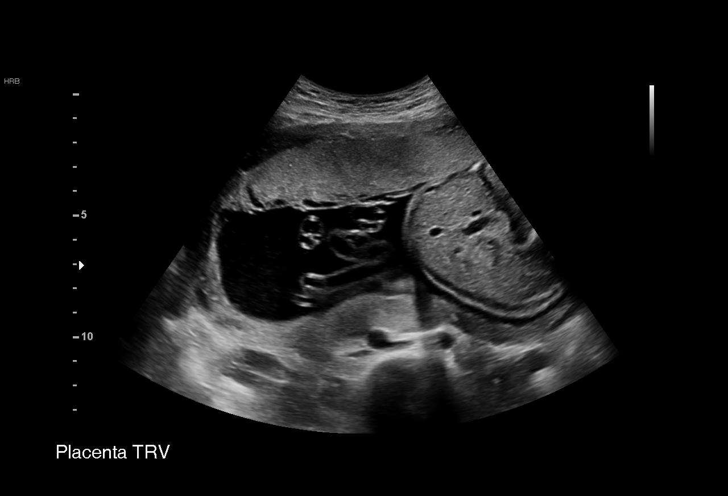
[im 13/50]
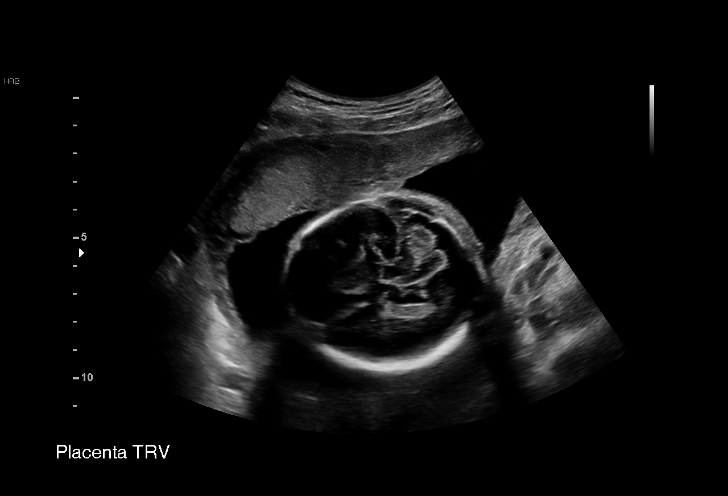
[im 17/50]
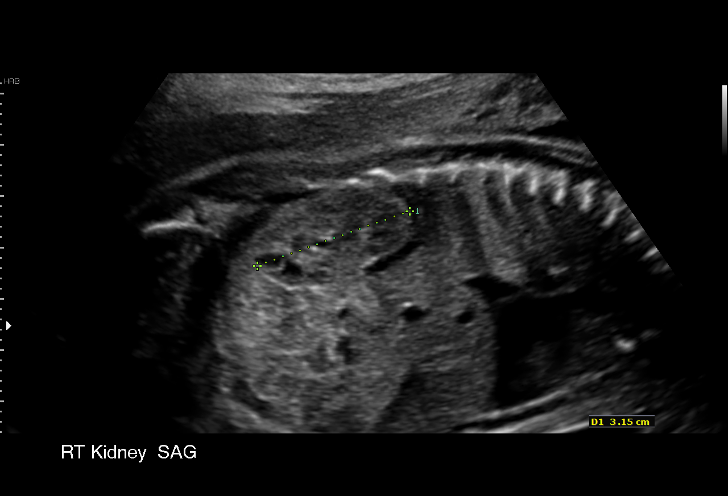
[im 20/50]
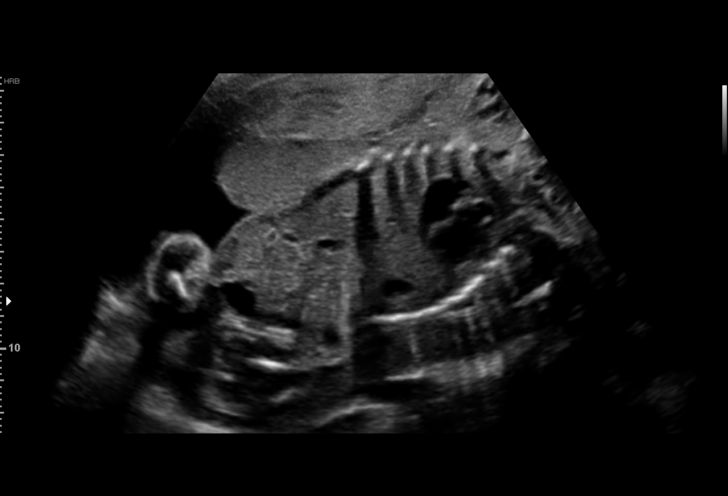
[im 24/50]
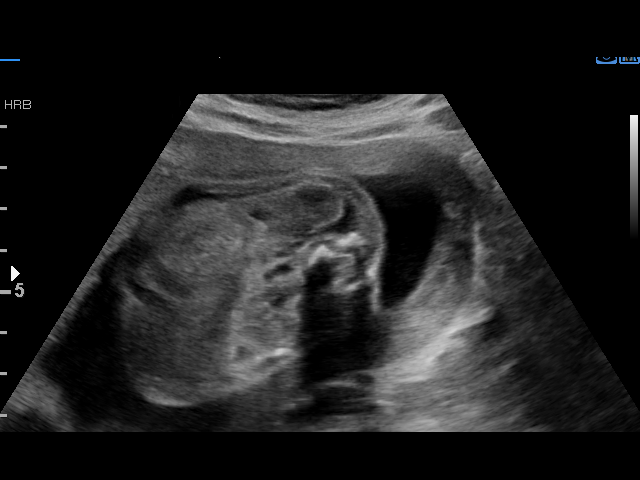
[im 28/50]
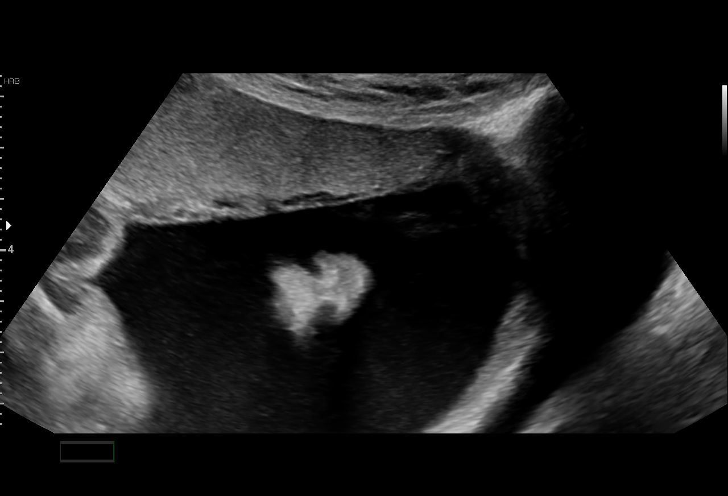
[im 31/50]
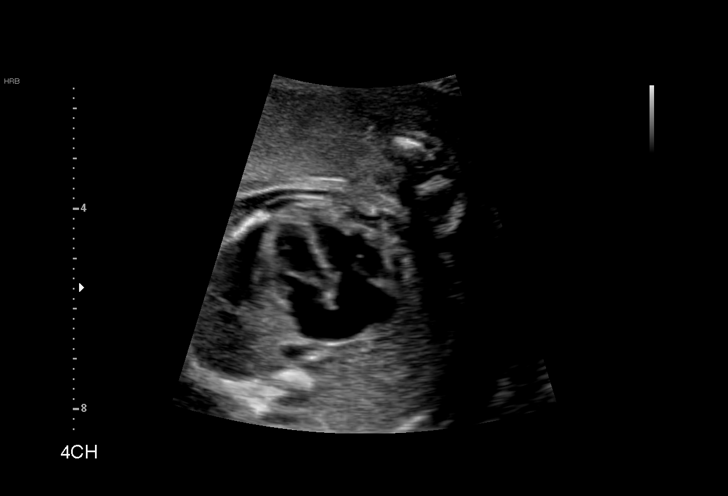
[im 35/50]
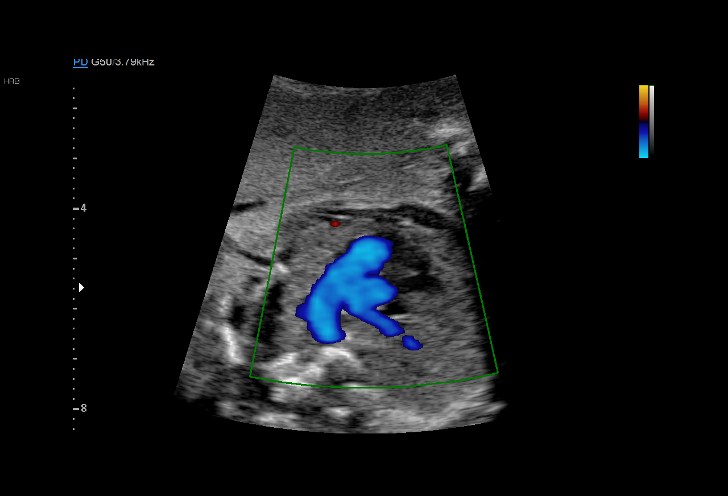
[im 39/50]
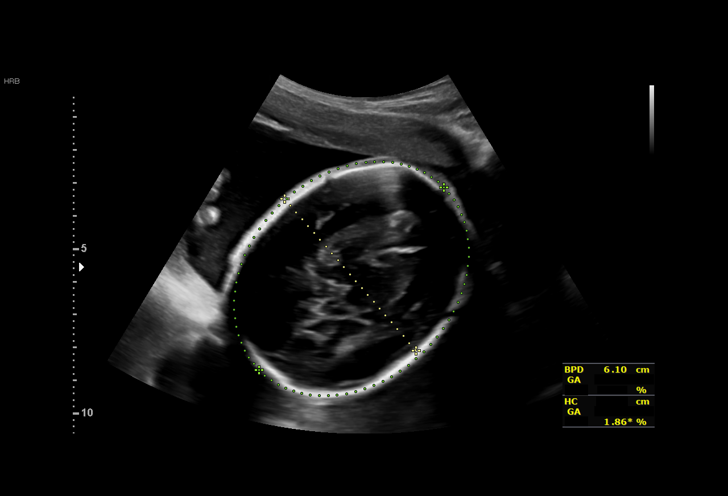
[im 42/50]
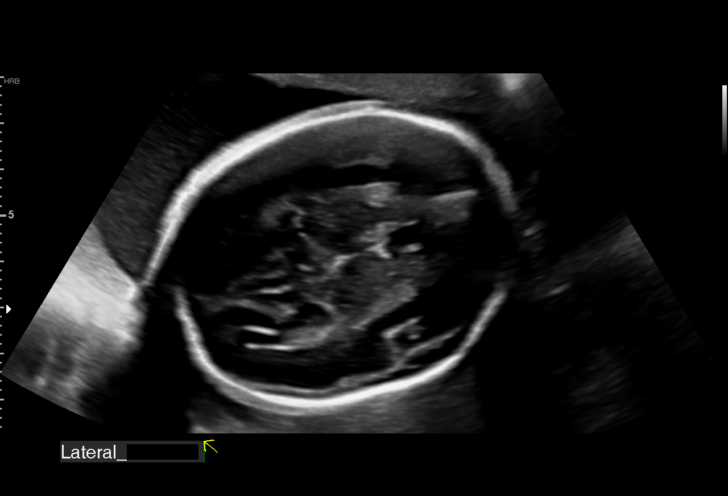
[im 46/50]
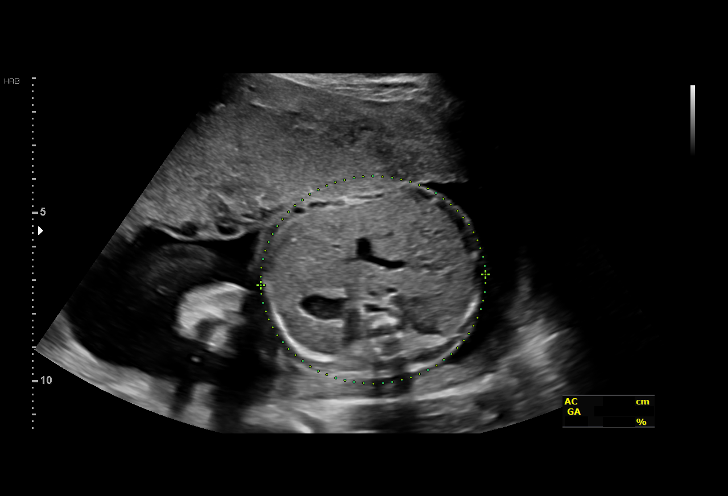
[im 50/50]
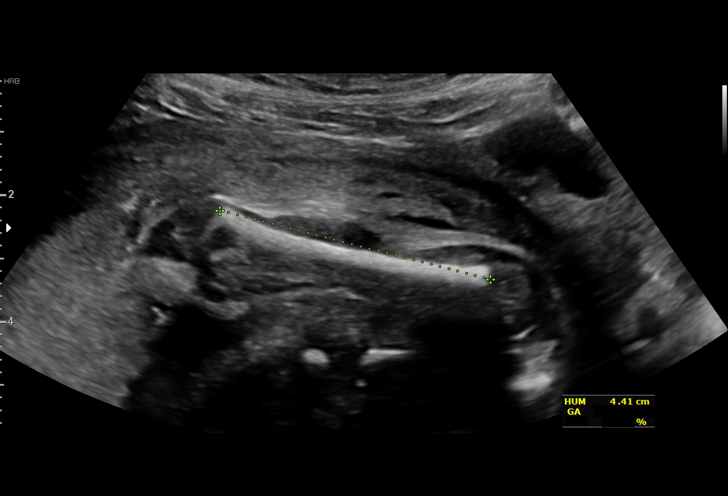

[14 of 28 positions shown; findings below may reference images not displayed]

[REDACTED]. [HOSPITAL],

1  HONORATO ZAYED            [id number]      [id number]     [id number]
Indications

25 weeks gestation of pregnancy
Encounter for other antenatal screening
follow-up
Pyelectasis of fetus on prenatal ultrasound
OB History

Blood Type:            Height:  5'5"   Weight (lb):  132       BMI:
Gravidity:    1
Fetal Evaluation

Num Of Fetuses:     1
Fetal Heart         150
Rate(bpm):
Cardiac Activity:   Observed
Presentation:       Cephalic
Placenta:           Anterior, above cervical os
P. Cord Insertion:  Previously Visualized

Amniotic Fluid
AFI FV:      Subjectively within normal limits

Largest Pocket(cm)
6.87
Biometry

BPD:      60.5  mm     G. Age:  24w 5d          8  %    CI:        74.78   %    70 - 86
FL/HC:      21.0   %    18.6 -
HC:       222   mm     G. Age:  24w 1d        < 3  %    HC/AC:      1.09        1.04 -
AC:       204   mm     G. Age:  25w 0d         18  %    FL/BPD:     77.0   %    71 - 87
FL:       46.6  mm     G. Age:  25w 4d         26  %    FL/AC:      22.8   %    20 - 24
HUM:      44.1  mm     G. Age:  26w 1d         54  %
Est. FW:     769  gm    1 lb 11 oz      36  %
Gestational Age

LMP:           25w 6d        Date:  05/15/17                 EDD:   02/19/18
U/S Today:     24w 6d                                        EDD:   02/26/18
Best:          25w 6d     Det. By:  LMP  (05/15/17)          EDD:   02/19/18
Anatomy

Cranium:               Appears normal         Aortic Arch:            Previously seen
Cavum:                 Appears normal         Ductal Arch:            Previously seen
Ventricles:            Appears normal         Diaphragm:              Appears normal
Choroid Plexus:        Previously seen        Stomach:                Appears normal, left
sided
Cerebellum:            Previously seen        Abdomen:                Appears normal
Posterior Fossa:       Previously seen        Abdominal Wall:         Previously seen
Nuchal Fold:           Previously seen        Cord Vessels:           Previously seen
Face:                  Orbits and profile     Kidneys:                Appear normal
previously seen
Lips:                  Appears normal         Bladder:                Appears normal
Thoracic:              Appears normal         Spine:                  Limited views
appear normal
Heart:                 Appears normal         Upper Extremities:      Previously seen
(4CH, axis, and situs
RVOT:                  Appears normal         Lower Extremities:      Previously seen
LVOT:                  Appears normal

Other:  Female gender. Heels and 5th digit prev visualized. Open hands prev
visualized. Nasal bone prev visualized.
Cervix Uterus Adnexa

Cervix
Length:            3.5  cm.
Normal appearance by transabdominal scan.
Impression

SIUP at 25+6 weeks with cardiac activity
Normal interval anatomy; anatomic survey complete; UTD A1
resolved
Normal amniotic fluid volume
Appropriate interval growth with EFW at the 36th %tile
Recommendations

Follow-up as clinically indicated

## 2019-05-29 ENCOUNTER — Other Ambulatory Visit: Payer: Self-pay

## 2019-05-29 MED ORDER — IBUPROFEN 600 MG PO TABS: 600.0000 mg | ORAL_TABLET | Freq: Four times a day (QID) | ORAL | 0 refills | Status: DC | PRN

## 2019-05-29 MED ORDER — IBUPROFEN 600 MG PO TABS: 600.0000 mg | ORAL_TABLET | Freq: Four times a day (QID) | ORAL | 3 refills | Status: AC | PRN

## 2019-05-29 NOTE — Addendum Note (Signed)
Addended by: Mora Bellman on: 05/29/2019 01:09 PM   Modules accepted: Orders

## 2020-08-16 IMAGING — US US PELVIS COMPLETE TRANSABD/TRANSVAG W DUPLEX
1 series · 13 of 25 positions shown · non-contrast
Comparison: 12/07/2017.

CLINICAL DATA: PID.

EXAM:
TRANSABDOMINAL AND TRANSVAGINAL ULTRASOUND OF PELVIS
DOPPLER ULTRASOUND OF OVARIES
TECHNIQUE: Both transabdominal and transvaginal ultrasound examinations of the
pelvis were performed. Transabdominal technique was performed for
global imaging of the pelvis including uterus, ovaries, adnexal
regions, and pelvic cul-de-sac.
It was necessary to proceed with endovaginal exam following the
transabdominal exam to visualize the uterus and ovaries. Color and
duplex Doppler ultrasound was utilized to evaluate blood flow to the
ovaries.

[Series 1: us pelvis complete transabd/transvag w duplex · 13 of 93 slices shown]
[im 1/93]
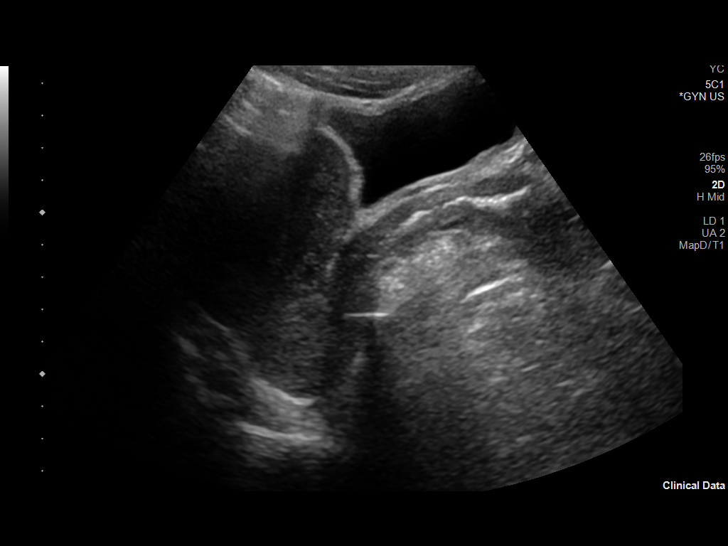
[im 8/93]
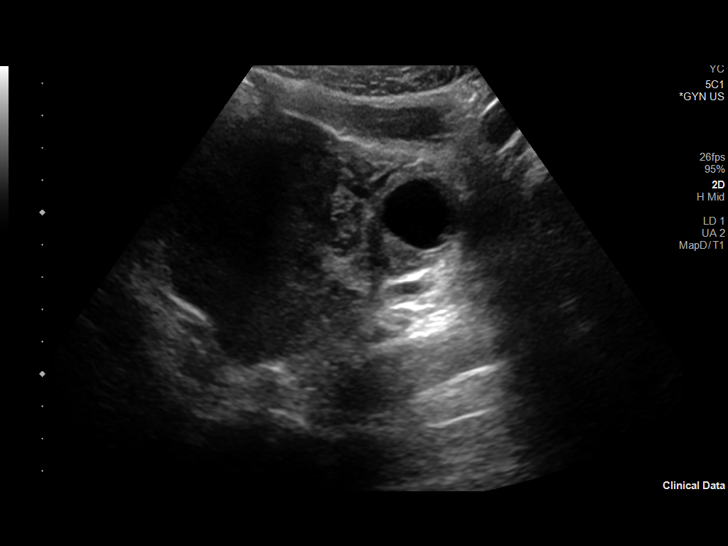
[im 16/93]
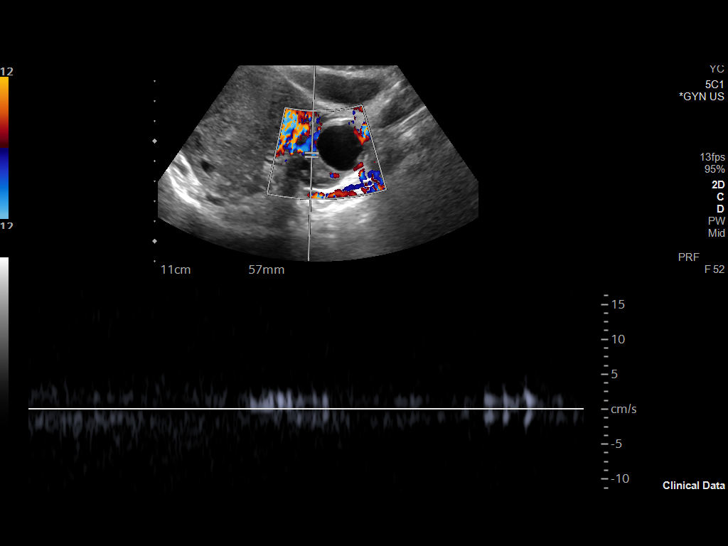
[im 24/93]
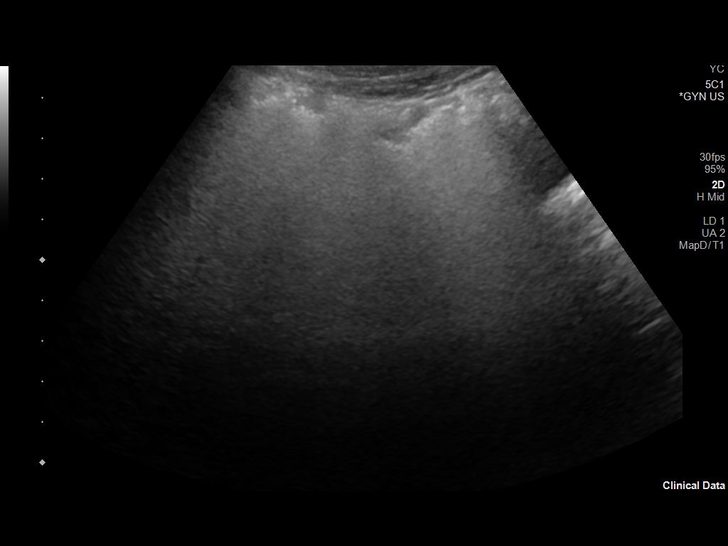
[im 31/93]
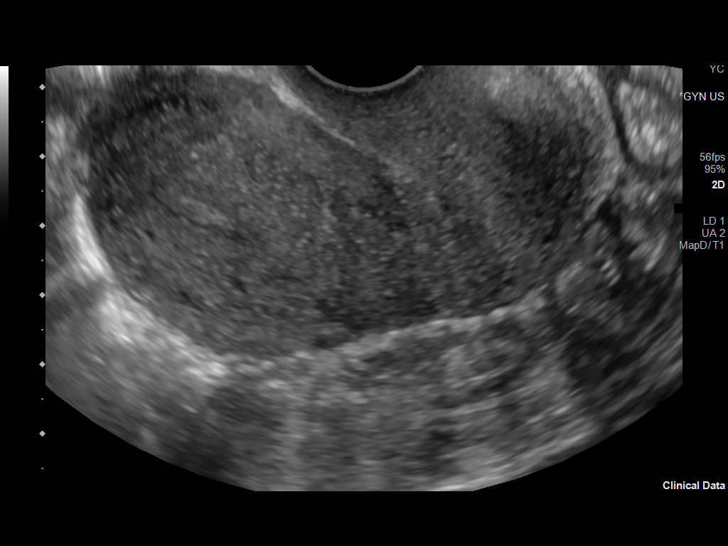
[im 39/93]
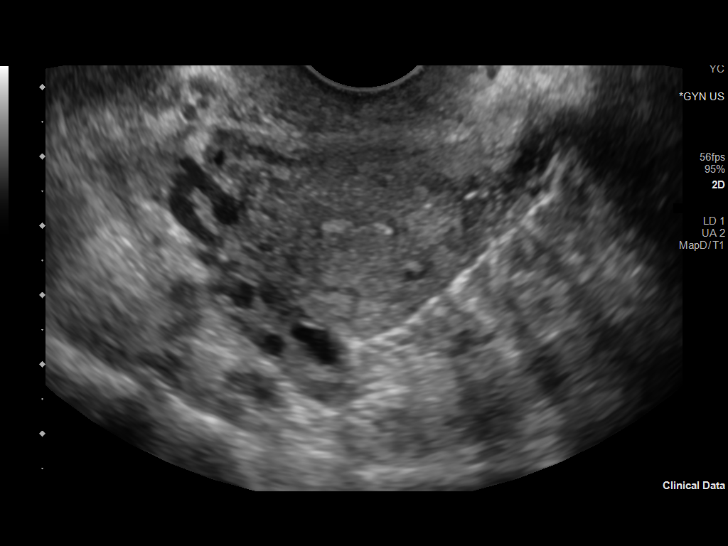
[im 47/93]
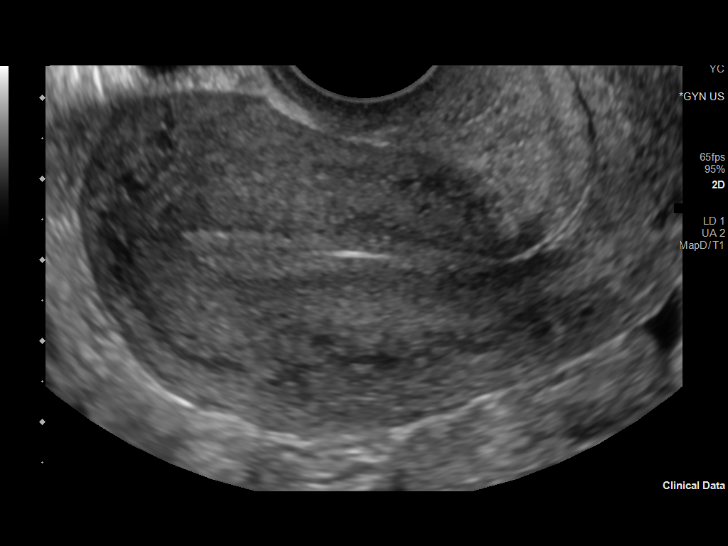
[im 54/93]
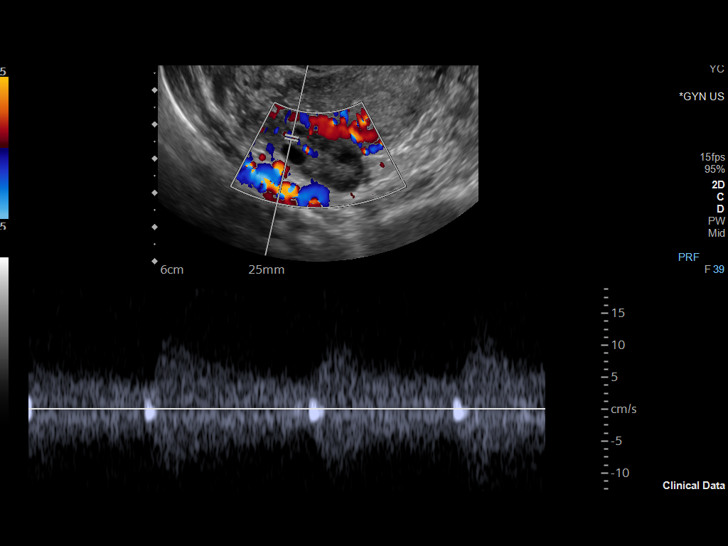
[im 62/93]
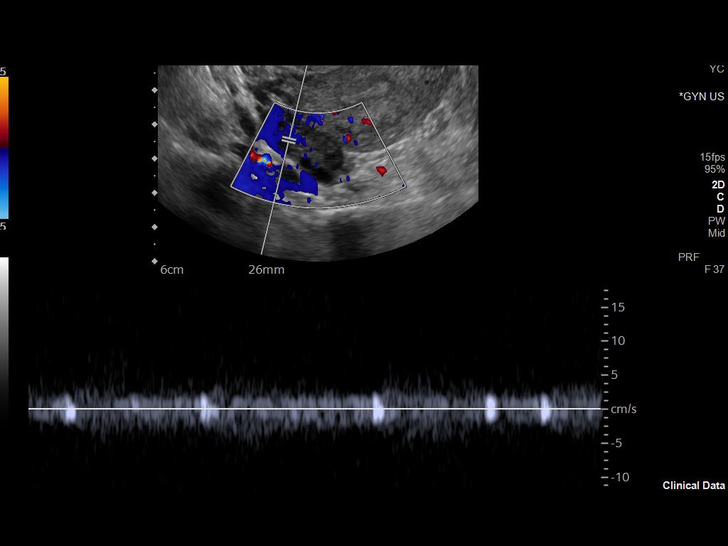
[im 70/93]
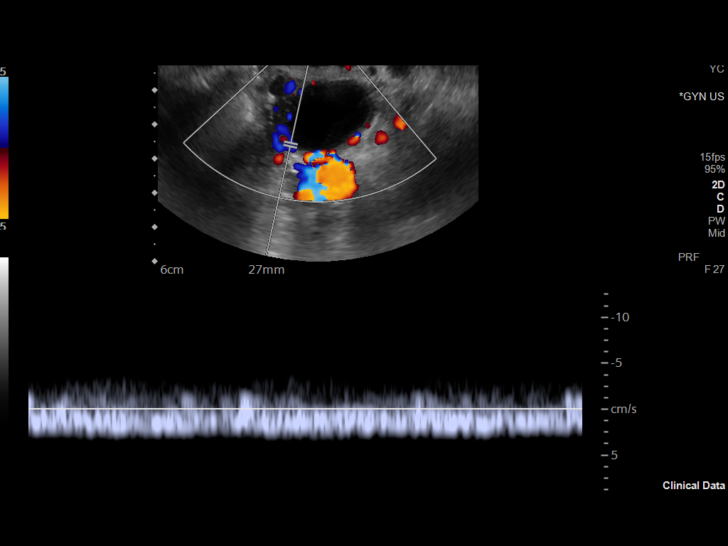
[im 77/93]
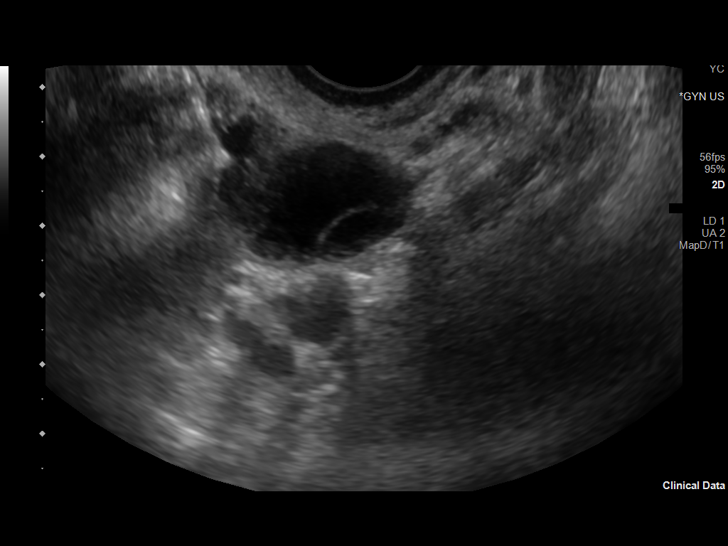
[im 85/93]
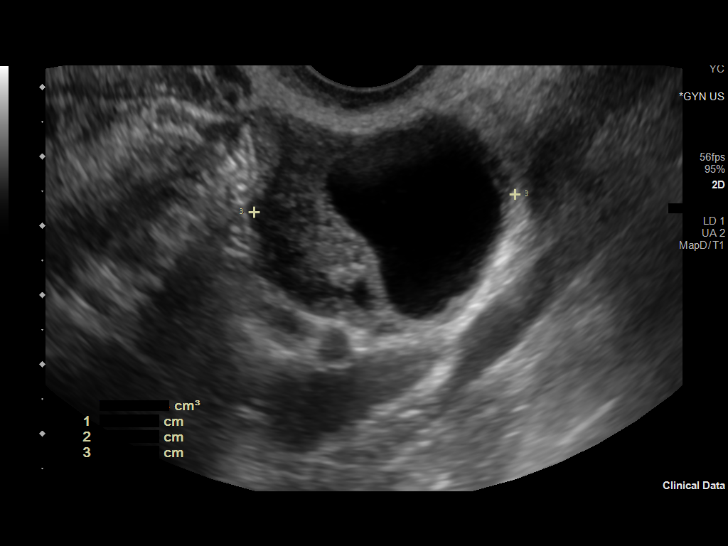
[im 93/93]
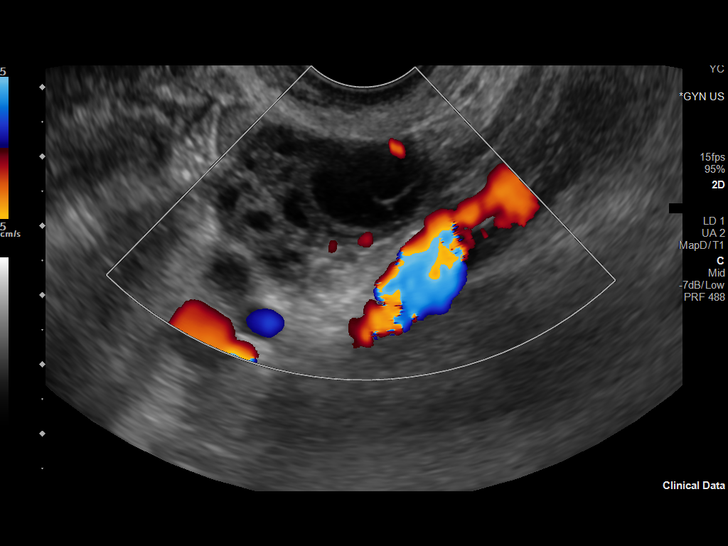

[13 of 25 positions shown; findings below may reference images not displayed]

FINDINGS: Uterus

Measurements: 8.0 x 4.0 x 5.2 cm = volume: 88.1 mL. No fibroids or
other mass visualized.

Endometrium

Thickness: 6.3 mm.  No focal abnormality visualized.

Right ovary

Measurements: 3.4 x 1.4 x 2.4 cm = volume: 5.8 mL. Normal
appearance/no adnexal mass.

Left ovary

Measurements: 4.6 x 3.3 x 3.8 cm = volume: 29.4 mL. 2.5 x 2.5 x
cm complex cyst. Differential includes a complex physiological cyst,
ectopic pregnancy, PID/tiny tubo-ovarian abscess. Pregnancy test to
exclude ectopic pregnancy and follow-up exam to demonstrate
resolution suggested.

Pulsed Doppler evaluation of both ovaries demonstrates normal
low-resistance arterial and venous waveforms.

Other findings

Trace free pelvic fluid.
IMPRESSION: 1. 2.5 x 2.5 x 2.4 cm complex left ovarian cyst. Differential
includes a complex visualized GB cyst, ectopic pregnancy, PID/tiny
tubo-ovarian abscess. Pregnancy test to exclude ectopic pregnancy
and follow-up exam to demonstrate resolution suggested.
Short-interval follow up ultrasound in 6-12 weeks is recommended,
preferably during the week following the patient's normal menses.

2.  No evidence of ovarian torsion.

3.  Trace free pelvic fluid.

## 2020-09-16 DIAGNOSIS — Z13220 Encounter for screening for lipoid disorders: Secondary | ICD-10-CM | POA: Diagnosis not present

## 2020-09-16 DIAGNOSIS — Z124 Encounter for screening for malignant neoplasm of cervix: Secondary | ICD-10-CM | POA: Diagnosis not present

## 2020-09-16 DIAGNOSIS — Z113 Encounter for screening for infections with a predominantly sexual mode of transmission: Secondary | ICD-10-CM | POA: Diagnosis not present

## 2020-09-16 DIAGNOSIS — Z0000 Encounter for general adult medical examination without abnormal findings: Secondary | ICD-10-CM | POA: Diagnosis not present

## 2020-09-16 DIAGNOSIS — Z23 Encounter for immunization: Secondary | ICD-10-CM | POA: Diagnosis not present

## 2020-11-05 DIAGNOSIS — Z202 Contact with and (suspected) exposure to infections with a predominantly sexual mode of transmission: Secondary | ICD-10-CM | POA: Diagnosis not present

## 2020-11-05 DIAGNOSIS — Z7689 Persons encountering health services in other specified circumstances: Secondary | ICD-10-CM | POA: Diagnosis not present

## 2021-06-03 DIAGNOSIS — Z23 Encounter for immunization: Secondary | ICD-10-CM | POA: Diagnosis not present

## 2021-06-15 DIAGNOSIS — Z309 Encounter for contraceptive management, unspecified: Secondary | ICD-10-CM | POA: Diagnosis not present

## 2021-06-15 DIAGNOSIS — Z113 Encounter for screening for infections with a predominantly sexual mode of transmission: Secondary | ICD-10-CM | POA: Diagnosis not present

## 2021-08-22 DIAGNOSIS — Z0000 Encounter for general adult medical examination without abnormal findings: Secondary | ICD-10-CM | POA: Diagnosis not present

## 2021-08-22 DIAGNOSIS — Z13220 Encounter for screening for lipoid disorders: Secondary | ICD-10-CM | POA: Diagnosis not present

## 2021-08-22 DIAGNOSIS — D649 Anemia, unspecified: Secondary | ICD-10-CM | POA: Diagnosis not present

## 2021-10-11 DIAGNOSIS — Z113 Encounter for screening for infections with a predominantly sexual mode of transmission: Secondary | ICD-10-CM | POA: Diagnosis not present

## 2021-10-11 DIAGNOSIS — Z1159 Encounter for screening for other viral diseases: Secondary | ICD-10-CM | POA: Diagnosis not present

## 2021-11-25 DIAGNOSIS — E559 Vitamin D deficiency, unspecified: Secondary | ICD-10-CM | POA: Diagnosis not present

## 2021-11-25 DIAGNOSIS — E7800 Pure hypercholesterolemia, unspecified: Secondary | ICD-10-CM | POA: Diagnosis not present

## 2022-02-22 DIAGNOSIS — L209 Atopic dermatitis, unspecified: Secondary | ICD-10-CM | POA: Diagnosis not present

## 2022-02-22 DIAGNOSIS — H01119 Allergic dermatitis of unspecified eye, unspecified eyelid: Secondary | ICD-10-CM | POA: Diagnosis not present

## 2022-05-11 DIAGNOSIS — Z23 Encounter for immunization: Secondary | ICD-10-CM | POA: Diagnosis not present

## 2022-08-28 ENCOUNTER — Other Ambulatory Visit: Payer: Self-pay | Admitting: Family Medicine

## 2022-08-28 DIAGNOSIS — N6459 Other signs and symptoms in breast: Secondary | ICD-10-CM

## 2022-08-28 DIAGNOSIS — N644 Mastodynia: Secondary | ICD-10-CM

## 2022-09-20 ENCOUNTER — Ambulatory Visit
Admission: RE | Admit: 2022-09-20 | Discharge: 2022-09-20 | Disposition: A | Discharge status: Home / Self Care | Payer: No Typology Code available for payment source | Source: Ambulatory Visit | Attending: Family Medicine | Admitting: Family Medicine

## 2022-09-20 ENCOUNTER — Other Ambulatory Visit: Payer: Self-pay | Admitting: Family Medicine

## 2022-09-20 DIAGNOSIS — N6459 Other signs and symptoms in breast: Secondary | ICD-10-CM

## 2022-09-20 DIAGNOSIS — N644 Mastodynia: Secondary | ICD-10-CM

## 2022-09-20 DIAGNOSIS — R921 Mammographic calcification found on diagnostic imaging of breast: Secondary | ICD-10-CM

## 2022-09-22 ENCOUNTER — Other Ambulatory Visit: Payer: Self-pay | Admitting: Family Medicine

## 2022-09-22 DIAGNOSIS — R921 Mammographic calcification found on diagnostic imaging of breast: Secondary | ICD-10-CM
